# Patient Record
Sex: Male | Born: 1985 | Race: White | Hispanic: No | Smoking: Current some day smoker
Health system: Southern US, Community
[De-identification: ages and names within clinical notes are randomized; demographics above are authoritative.]

## PROBLEM LIST (undated history)

## (undated) DIAGNOSIS — F419 Anxiety disorder, unspecified: Secondary | ICD-10-CM

## (undated) DIAGNOSIS — B1920 Unspecified viral hepatitis C without hepatic coma: Secondary | ICD-10-CM

## (undated) DIAGNOSIS — F32A Depression, unspecified: Secondary | ICD-10-CM

## (undated) DIAGNOSIS — F1910 Other psychoactive substance abuse, uncomplicated: Secondary | ICD-10-CM

## (undated) HISTORY — DX: Other psychoactive substance abuse, uncomplicated: F19.10

## (undated) HISTORY — DX: Unspecified viral hepatitis C without hepatic coma: B19.20

## (undated) HISTORY — DX: Depression, unspecified: F32.A

## (undated) HISTORY — DX: Anxiety disorder, unspecified: F41.9

---
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
## 2006-10-30 ENCOUNTER — Emergency Department (HOSPITAL_COMMUNITY): Admission: EM | Admit: 2006-10-30 | Discharge: 2006-10-31 | Payer: Self-pay | Admitting: Emergency Medicine

## 2012-06-19 ENCOUNTER — Ambulatory Visit: Payer: Self-pay | Admitting: Emergency Medicine

## 2012-06-19 ENCOUNTER — Ambulatory Visit
Admission: RE | Admit: 2012-06-19 | Discharge: 2012-06-19 | Disposition: A | Discharge status: Home / Self Care | Payer: Self-pay | Source: Ambulatory Visit | Attending: Emergency Medicine | Admitting: Emergency Medicine

## 2012-06-19 VITALS — BP 122/80 | HR 67 | Temp 98.4°F | Resp 16 | Ht 70.0 in | Wt 183.0 lb

## 2012-06-19 DIAGNOSIS — G8929 Other chronic pain: Secondary | ICD-10-CM

## 2012-06-19 DIAGNOSIS — W57XXXA Bitten or stung by nonvenomous insect and other nonvenomous arthropods, initial encounter: Secondary | ICD-10-CM

## 2012-06-19 DIAGNOSIS — G44209 Tension-type headache, unspecified, not intractable: Secondary | ICD-10-CM

## 2012-06-19 DIAGNOSIS — Z8489 Family history of other specified conditions: Secondary | ICD-10-CM

## 2012-06-19 MED ORDER — MELOXICAM 7.5 MG PO TABS: ORAL_TABLET | ORAL | Status: DC

## 2012-06-19 MED ORDER — DOXYCYCLINE HYCLATE 100 MG PO TABS: 100.0000 mg | ORAL_TABLET | Freq: Two times a day (BID) | ORAL | Status: AC

## 2012-06-19 NOTE — Patient Instructions (Addendum)
Please proceed and have your CT scan done.

## 2012-06-19 NOTE — Progress Notes (Signed)
  Subjective:    Patient ID: Dale Graham, male    DOB: 11-10-1985, 26 y.o.   MRN: [id number] Patient comes into our office today with a headache he's had for 7 days he rates is pain a 7 out of 10 Patient had a low grade fever 5 or 6 days ago little cough  No chills or numbness has had a few small tick bites on his ankle, legs and scrotum no vomiting no sweats    Sore Throat  Associated symptoms include coughing (slight cough) and headaches (hurt all over more on right side of of head).  Headache  Associated symptoms include coughing (slight cough) and a fever (low grade 99.5).  Cough Associated symptoms include a fever (low grade 99.5) and headaches (hurt all over more on right side of of head).      Review of Systems  Constitutional: Positive for fever (low grade 99.5).  Respiratory: Positive for cough (slight cough).   Neurological: Positive for headaches (hurt all over more on right side of of head). Negative for light-headedness.       Objective:   Physical Exam HEENT exam is unremarkable. Neck is supple. Chest is clear to auscultation and percussion. Heart regular with no murmurs. Cranial nerves II through XII are intact motor strength is 5 out of 5 all muscle groups sensory exam is unremarkable  There are multiple insect bite areas over both ankles.      Assessment & Plan:  Patient with a one-week history of severe right occipital headache. He has a history of multiple tick bites. We'll cover with doxycycline x2 weeks plan on imaging his head give Mobic for his headache

## 2012-06-21 ENCOUNTER — Telehealth: Payer: Self-pay

## 2012-06-21 MED ORDER — TRAMADOL HCL 50 MG PO TABS
50.0000 mg | ORAL_TABLET | Freq: Four times a day (QID) | ORAL | Status: AC | PRN
Start: 2012-06-21 — End: 2012-07-01

## 2012-06-21 NOTE — Telephone Encounter (Signed)
Patient called stating that HA is worse while on Mobic, but he did not get Doxy filled due to cost.  I stressed importance of getting Doxy filled, and contacting other pharmacies to see if generic is available.  Maralyn Sago ok with having patient d/c Mobic and change to Ultram, if unable to afford--he can use Motrin OTC.  RTC if no improvement.  Patient agreed.

## 2012-06-21 NOTE — Telephone Encounter (Signed)
Pt states he saw dr Cleta Alberts for a headache yesterday and feels that the medication that he was given is making his headache worse   Best number [phone]

## 2012-06-26 ENCOUNTER — Encounter (HOSPITAL_COMMUNITY): Payer: Self-pay | Admitting: Emergency Medicine

## 2012-06-26 ENCOUNTER — Emergency Department (HOSPITAL_COMMUNITY)
Admission: EM | Admit: 2012-06-26 | Discharge: 2012-06-26 | Disposition: A | Discharge status: Home / Self Care | Payer: Self-pay | Attending: Emergency Medicine | Admitting: Emergency Medicine

## 2012-06-26 DIAGNOSIS — F17200 Nicotine dependence, unspecified, uncomplicated: Secondary | ICD-10-CM | POA: Insufficient documentation

## 2012-06-26 DIAGNOSIS — R51 Headache: Secondary | ICD-10-CM | POA: Insufficient documentation

## 2012-06-26 MED ORDER — SODIUM CHLORIDE 0.9 % IV BOLUS (SEPSIS)
1000.0000 mL | Freq: Once | INTRAVENOUS | Status: AC
  Administered 2012-06-26: 1000 mL via INTRAVENOUS

## 2012-06-26 MED ORDER — DEXAMETHASONE SODIUM PHOSPHATE 10 MG/ML IJ SOLN
INTRAMUSCULAR | Status: AC
  Filled 2012-06-26: qty 1

## 2012-06-26 MED ORDER — METOCLOPRAMIDE HCL 5 MG/ML IJ SOLN
10.0000 mg | Freq: Once | INTRAMUSCULAR | Status: AC
  Administered 2012-06-26: 10 mg via INTRAVENOUS
  Filled 2012-06-26: qty 2

## 2012-06-26 MED ORDER — DIPHENHYDRAMINE HCL 50 MG/ML IJ SOLN
25.0000 mg | Freq: Once | INTRAMUSCULAR | Status: AC
  Administered 2012-06-26: 25 mg via INTRAVENOUS
  Filled 2012-06-26: qty 1

## 2012-06-26 MED ORDER — DEXAMETHASONE SODIUM PHOSPHATE 4 MG/ML IJ SOLN
10.0000 mg | Freq: Once | INTRAMUSCULAR | Status: AC
  Administered 2012-06-26: 10 mg via INTRAVENOUS
  Filled 2012-06-26: qty 1

## 2012-06-26 MED ORDER — IBUPROFEN 800 MG PO TABS: 800.0000 mg | ORAL_TABLET | ORAL | Status: AC | PRN

## 2012-06-26 NOTE — ED Provider Notes (Signed)
History     CSN: [id number]  Arrival date & time 06/26/12  0441   First MD Initiated Contact with Patient 06/26/12 [phone]      Chief Complaint  Patient presents with  . Headache    (Consider location/radiation/quality/duration/timing/severity/associated sxs/prior treatment) HPI Comments: [place] 26 y.o. male   The chief complaint is: Patient presents with:   Headache   The patient has medical history significant for:   History reviewed. No pertinent past medical history.  Patient presents with persistent headaches for 2 weeks. Patient was seen at urgent care and given doxy and tramadol for suspected lyme disease as patient has had recent tick exposure. Patient states that the headaches are mostly right sided or temporal. Associated symptoms include photophobia. Patient states that he has some relief when taking Excedrin migraine. Patient believes his headaches may be due to a recent decrease in caffeine and or needing to wear glasses. Denies fever or chills. Denies cough, congestion, or sinus tenderness. Denies NVD. Denies neck stiffness. Denies that this is the worst headache of his life. Denies visual disturbance, aura, diplopia.   Of note, patient's mother works at an imaging center where she states he had a CT scan that was negative.       The history is provided by the patient.    History reviewed. No pertinent past medical history.  History reviewed. No pertinent past surgical history.  No family history on file.  History  Substance Use Topics  . Smoking status: Current Everyday Smoker  . Smokeless tobacco: Not on file  . Alcohol Use: No      Review of Systems  Constitutional: Negative for fever and chills.  HENT: Negative for congestion and sinus pressure.   Eyes: Positive for photophobia. Negative for visual disturbance.  Respiratory: Negative for cough.   Gastrointestinal: Negative for nausea, vomiting and diarrhea.  Neurological: Positive for  headaches.  Psychiatric/Behavioral: Negative for behavioral problems.  All other systems reviewed and are negative.    Allergies  Amoxicillin  Home Medications   Current Outpatient Rx  Name Route Sig Dispense Refill  . DOXYCYCLINE HYCLATE 100 MG PO TABS Oral Take 1 tablet (100 mg total) by mouth 2 (two) times daily. 28 tablet 0  . IBUPROFEN 800 MG PO TABS Oral Take 1 tablet (800 mg total) by mouth as needed (for headache). 21 tablet 0  . MELOXICAM 7.5 MG PO TABS  Take 1-2 tablets daily as needed for headache 30 tablet 0  . METHADONE HCL 10 MG/ML PO CONC Oral Take 70 mg by mouth daily.    . TRAMADOL HCL 50 MG PO TABS Oral Take 1 tablet (50 mg total) by mouth every 6 (six) hours as needed for pain. 20 tablet 0    BP 144/89  Pulse 76  Temp 97.8 F (36.6 C) (Oral)  Resp 20  SpO2 100%  Physical Exam  Nursing note and vitals reviewed. Constitutional: He appears well-developed and well-nourished. No distress.  HENT:  Head: Normocephalic and atraumatic.  Mouth/Throat: Oropharynx is clear and moist.       Patient has no tenderness to palpation of the maxillary or frontal sinuses.  Patient had no meningeal signs.  Eyes: Conjunctivae and EOM are normal. Pupils are equal, round, and reactive to light. No scleral icterus.  Neck: Normal range of motion. Neck supple.  Cardiovascular: Normal rate, regular rhythm and normal heart sounds.   Pulmonary/Chest: Effort normal and breath sounds normal.  Abdominal: Soft. Bowel sounds are normal.  There is no tenderness.  Musculoskeletal: Normal range of motion.  Lymphadenopathy:    He has no cervical adenopathy.  Neurological: He is alert. No cranial nerve deficit. He exhibits normal muscle tone.       Cranial nerves II-XII intact, no sensory deficits.  Skin: Skin is warm and dry.    ED Course  Procedures (including critical care time)  Labs Reviewed - No data to display No results found.   1. Headache       MDM  Patient presented  with headache X 2 weeks, with associated photophobia. Patient given migraine cocktail with improvement and resolution of his headaches. As for suspected lyme disease in nursing note, Dr. Dierdre Highman not convinced that these headaches are secondary to lyme encephalitis. Patient discharged on Ibuprofen with strict return precautions. Patient given recommendation to have vision screening. No red flags for subarachnoid, intercranial mass, or meningitis.         Pixie Casino, PA-C 06/26/12 [phone]

## 2012-06-26 NOTE — ED Notes (Signed)
PT. REPORTS PERSISTENT HEADACHE FOR 2 WEEKS , SEEN AT AN URGENT CARE PRESCRIBED WITH DOXYCYCLINE AND TRAMADOL ( SUSPECTED LYME DISEASE) , PAIN WORSE THIS MORNING .

## 2012-06-26 NOTE — ED Provider Notes (Signed)
Medical screening examination/treatment/procedure(s) were conducted as a shared visit with non-physician practitioner(s) and myself.  I personally evaluated the patient during the encounter. No neuro deficitis on exam, no meningismus, HA does not suggest infectious etiology or acute intracerebral catastrophe. Has had recent CT head for this HA and is improved in ED and stable for d/c home.   Sunnie Nielsen, MD 06/26/12 [phone]

## 2012-06-30 ENCOUNTER — Ambulatory Visit: Payer: Self-pay | Admitting: Emergency Medicine

## 2012-06-30 VITALS — BP 116/82 | HR 90 | Temp 98.3°F | Resp 16 | Ht 71.0 in | Wt 179.0 lb

## 2012-06-30 DIAGNOSIS — D7289 Other specified disorders of white blood cells: Secondary | ICD-10-CM

## 2012-06-30 DIAGNOSIS — R51 Headache: Secondary | ICD-10-CM

## 2012-06-30 DIAGNOSIS — R519 Headache, unspecified: Secondary | ICD-10-CM

## 2012-06-30 LAB — POCT CBC
Granulocyte percent: 51.6 %G (ref 37–80)
MCH, POC: 31.8 pg — AB (ref 27–31.2)
MCV: 95.7 fL (ref 80–97)
MID (cbc): 0.7 (ref 0–0.9)
POC LYMPH PERCENT: 41.7 %L (ref 10–50)
POC MID %: 6.7 %M (ref 0–12)
Platelet Count, POC: 347 10*3/uL (ref 142–424)
RDW, POC: 13 %

## 2012-06-30 NOTE — Progress Notes (Signed)
  Subjective:    Patient ID: Dale Graham Graham, male    DOB: [birth year], 26 y.o.   MRN: [id number]  HPI patient here because he has had persistent headaches. He was seen about a week ago for his headaches and a CT was obtained which was normal. His headaches began about 3 weeks ago and the right temporal area. They can be as bad as an 8/10 in intensity. The headache became so severe he went to the emergency room and received treatment since that time his headaches have been a level of about 2-3/10. He does get nauseated at times with the headaches. His headaches are nonexertional. They tend to occur when he is laying down at night.    Review of Systems     Objective:   Physical Exam HEENT exam is unremarkable his chest is clear his cardiac exam is unremarkable. Cranial nerves II through XII are intact. Motor strength is 5 out of 5 in all muscle groups. Deep tendon reflexes are 2+ and symmetrical. There no pathologic reflexes noted. Romberg did not reveal any ataxia or dysmetria.  Results for orders placed in visit on 06/30/12  POCT CBC      Component Value Range   WBC 10.0  4.6 - 10.2 K/uL   Lymph, poc 4.2 (*) 0.6 - 3.4   POC LYMPH PERCENT 41.7  10 - 50 %L   MID (cbc) 0.7  0 - 0.9   POC MID % 6.7  0 - 12 %M   POC Granulocyte 5.2  2 - 6.9   Granulocyte percent 51.6  37 - 80 %G   RBC 4.91  4.69 - 6.13 M/uL   Hemoglobin 15.6  14.1 - 18.1 g/dL   HCT, POC 78.2  95.6 - 53.7 %   MCV 95.7  80 - 97 fL   MCH, POC 31.8 (*) 27 - 31.2 pg   MCHC 33.2  31.8 - 35.4 g/dL   RDW, POC 21.3     Platelet Count, POC 347  142 - 424 K/uL   MPV 8.2  0 - 99.8 fL        Assessment & Plan:  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 I am going to make referral to the headache Center for his evaluation

## 2012-07-01 LAB — EPSTEIN-BARR VIRUS VCA ANTIBODY PANEL: EBV NA IgG: 22.2 U/mL — ABNORMAL HIGH (ref <18.0)

## 2012-07-02 LAB — ROCKY MTN SPOTTED FVR AB, IGM-BLOOD: ROCKY MTN SPOTTED FEVER, IGM: 0.37 IV

## 2012-07-04 ENCOUNTER — Telehealth: Payer: Self-pay

## 2012-07-04 NOTE — Telephone Encounter (Signed)
PT HAD LAB WORK DONE OVER 4 DAYS AGO AND IS STILL WAITING TO HEAR FROM SOMEONE. PLEASE CALL [phone]

## 2012-07-04 NOTE — Telephone Encounter (Signed)
Will you review the RMSF titer with me? I will call patient to advise.

## 2012-07-05 NOTE — Telephone Encounter (Signed)
Spoke wirh pt and advised labs. Pt understood

## 2012-07-05 NOTE — Telephone Encounter (Signed)
Labs show a history of mono, but not a current infection.  All other labs negative/normal.  Ordering provider has not reviewed or commented on.

## 2013-05-01 ENCOUNTER — Ambulatory Visit (INDEPENDENT_AMBULATORY_CARE_PROVIDER_SITE_OTHER): Payer: BC Managed Care – PPO | Admitting: Physician Assistant

## 2013-05-01 VITALS — BP 120/80 | HR 91 | Temp 98.0°F | Resp 16 | Ht 69.0 in | Wt 144.0 lb

## 2013-05-01 DIAGNOSIS — S139XXA Sprain of joints and ligaments of unspecified parts of neck, initial encounter: Secondary | ICD-10-CM

## 2013-05-01 DIAGNOSIS — S161XXA Strain of muscle, fascia and tendon at neck level, initial encounter: Secondary | ICD-10-CM

## 2013-05-01 DIAGNOSIS — F1121 Opioid dependence, in remission: Secondary | ICD-10-CM

## 2013-05-01 MED ORDER — IBUPROFEN 800 MG PO TABS: 800.0000 mg | ORAL_TABLET | Freq: Three times a day (TID) | ORAL | Status: DC | PRN

## 2013-05-01 MED ORDER — CYCLOBENZAPRINE HCL 5 MG PO TABS: 5.0000 mg | ORAL_TABLET | Freq: Three times a day (TID) | ORAL | Status: DC | PRN

## 2013-05-01 NOTE — Progress Notes (Signed)
   [address] [place] [address]   Phone [phone]  Subjective:    Patient ID: Dale Graham, male    DOB: 1985-12-31, 27 y.o.   MRN: [id number]  HPI Pt presents to clinic with neck pain for about the last month without an injury that he knows of.  He does not have pain every day - drives a fork-lift for work.  He has done nothing for the pain - no heat or NSAIDs.  Pt has been off Methadone for about 7 months.  He also has a swelling on his neck that he has had for about 6 months and he wonders if they are related.   Review of Systems  Musculoskeletal: Positive for back pain (neck on the L).  Neurological: Negative for weakness and numbness.       Objective:   Physical Exam  Vitals reviewed. Constitutional: He is oriented to person, place, and time. He appears well-developed and well-nourished.  HENT:  Head: Normocephalic and atraumatic.  Right Ear: External ear normal.  Left Ear: External ear normal.  Pulmonary/Chest: Effort normal.  Musculoskeletal:       Cervical back: He exhibits spasm (L sternoicleidomastoid muscle spasms). He exhibits normal range of motion, no tenderness and no bony tenderness.  Neurological: He is alert and oriented to person, place, and time. He has normal strength. No sensory deficit.  Reflex Scores:      Bicep reflexes are 2+ on the right side and 2+ on the left side.      Brachioradialis reflexes are 2+ on the right side and 2+ on the left side. Skin: Skin is warm and dry.     Psychiatric: He has a normal mood and affect. His behavior is normal. Judgment and thought content normal.       Assessment & Plan:  Neck strain, initial encounter - Plan: ibuprofen (ADVIL,MOTRIN) 800 MG tablet, cyclobenzaprine (FLEXERIL) 5 MG tablet -- heat and stretches to the area -- recheck if no improvement.  If cyst starts to bother him he can make an appt for removal.    Benny Lennert PA-C 05/01/2013 6:06 PM

## 2015-09-18 ENCOUNTER — Encounter: Payer: Self-pay | Admitting: *Deleted

## 2015-09-18 ENCOUNTER — Emergency Department
Admission: EM | Admit: 2015-09-18 | Discharge: 2015-09-18 | Disposition: A | Discharge status: Home / Self Care | Payer: Self-pay | Attending: Emergency Medicine | Admitting: Emergency Medicine

## 2015-09-18 DIAGNOSIS — Y998 Other external cause status: Secondary | ICD-10-CM | POA: Insufficient documentation

## 2015-09-18 DIAGNOSIS — F17200 Nicotine dependence, unspecified, uncomplicated: Secondary | ICD-10-CM | POA: Insufficient documentation

## 2015-09-18 DIAGNOSIS — Y9289 Other specified places as the place of occurrence of the external cause: Secondary | ICD-10-CM | POA: Insufficient documentation

## 2015-09-18 DIAGNOSIS — W268XXA Contact with other sharp object(s), not elsewhere classified, initial encounter: Secondary | ICD-10-CM | POA: Insufficient documentation

## 2015-09-18 DIAGNOSIS — S61012A Laceration without foreign body of left thumb without damage to nail, initial encounter: Secondary | ICD-10-CM | POA: Insufficient documentation

## 2015-09-18 DIAGNOSIS — Y9389 Activity, other specified: Secondary | ICD-10-CM | POA: Insufficient documentation

## 2015-09-18 MED ORDER — CEPHALEXIN 500 MG PO CAPS: 500.0000 mg | ORAL_CAPSULE | Freq: Three times a day (TID) | ORAL | Status: AC

## 2015-09-18 NOTE — ED Notes (Signed)
Pt reports cutting a box with a box cutter about a half hour ago when he cut his left thumb on the side of the nail. Slight bleeding noted and pressure dressing applied.

## 2015-09-18 NOTE — ED Provider Notes (Signed)
[facility] Emergency Department Provider Note  ____________________________________________  Time seen:  5:55 AM  I have reviewed the triage vital signs and the nursing notes.  History by:  Patient  HISTORY  Chief Complaint Laceration  to left thumb    HPI Dale Graham is a 29 y.o. male who is short with a box cutter and cut the lateral portion of his distal left thumb. This is just adjacent to the nail of this thumb. He reports some bleeding but overall bleeding is controlled. He has moderate discomfort but no acute distress.    History reviewed. No pertinent past medical history.  Patient Active Problem List   Diagnosis Date Noted  . History of narcotic addiction (HCC) 05/01/2013    History reviewed. No pertinent past surgical history.  Current Outpatient Rx  Name  Route  Sig  Dispense  Refill  . cyclobenzaprine (FLEXERIL) 5 MG tablet   Oral   Take 1 tablet (5 mg total) by mouth 3 (three) times daily as needed for muscle spasms.   30 tablet   0   . ibuprofen (ADVIL,MOTRIN) 800 MG tablet   Oral   Take 1 tablet (800 mg total) by mouth every 8 (eight) hours as needed for pain.   30 tablet   0     Allergies Review of patient's allergies indicates no active allergies.  History reviewed. No pertinent family history.  Social History Social History  Substance Use Topics  . Smoking status: Current Every Day Smoker  . Smokeless tobacco: Never Used  . Alcohol Use: Yes     Comment: rarely    Review of Systems  Constitutional: Negative for fever/chills. ENT: Negative for congestion. Cardiovascular: Negative for chest pain. Respiratory: Negative for cough. Gastrointestinal: Negative for abdominal pain, vomiting and diarrhea. Genitourinary: Negative for dysuria. Musculoskeletal: No back pain. Skin: Laceration to left thumb. See history of present illness. Neurological: Negative for headache or focal weakness   10-point ROS  otherwise negative.  ____________________________________________   PHYSICAL EXAM:  VITAL SIGNS: ED Triage Vitals  Enc Vitals Group     BP 09/18/15 0521 147/91 mmHg     Pulse Rate 09/18/15 0521 104     Resp 09/18/15 0521 18     Temp 09/18/15 0521 99.1 F (37.3 C)     Temp Source 09/18/15 0521 Oral     SpO2 09/18/15 0521 94 %     Weight --      Height 09/18/15 0521 6' (1.829 m)     Head Cir --      Peak Flow --      Pain Score 09/18/15 0505 6     Pain Loc --      Pain Edu? --      Excl. in GC? --     Constitutional:  Alert and oriented. Well appearing and in no distress. Cardiovascular: Normal rate, regular rhythm, no murmur noted Respiratory:  Normal respiratory effort, no tachypnea.    Breath sounds are clear and equal bilaterally.  Gastrointestinal: Soft, no distention. Nontender Musculoskeletal: Noted oblique flap laceration to the lateral edge of the distal left thumb. Other than this there is no deformity. Nontender with normal range of motion in all other extremities.  No noted edema. Neurologic:  Communicative. Normal appearing spontaneous movement in all 4 extremities. No gross focal neurologic deficits are appreciated.  Skin:  There is an oblique laceration undermining some of the tissue of the distal lateral left thumb. This semicircular cut measures  approximately 1-1/2 cm across. The flap appears to be full thickness and intact, however the skin color over the flap is slightly dusky. The edges of the flap are darker. I have pointed this out to the patient and discussed the stability for slower healing or nonviable skin edges. Psychiatric: Mood and affect are normal. Speech and behavior are normal.  ____________________________________________     PROCEDURES  Laceration repair   After discussing options for repair of laceration, the patient I agreed on Steri-Strips. We discussed sutures, that he preferred Steri-Strips and I felt that the additional trauma to the  skin edges would not be beneficial. We have discussed the care he needs to take but this wound and the possibility that he will have skin that is nonviable even the dusky appearance of some of the skin.  The patient soaked in a mild Betadine solution for approximate 5 minutes, then I further irrigated the wound and explore the wound. There were no foreign bodies. We then dried and clean the area and applied benzoin to the outer skin. Using quarter-inch Steri-Strips, I was able to hold the flap down firmly with good approximation.  ____________________________________________   INITIAL IMPRESSION / ASSESSMENT AND PLAN / ED COURSE  Pertinent labs & imaging results that were available during my care of the patient were reviewed by me and considered in my medical decision making (see chart for details).  Oblique laceration to the left thumb. Question of blood supply to portions of it. Overall the flap looks viable, but I discussed the possibility of slow healing or nonviable with the patient. He preferred Steri-Strips. The wound was closed in this fashion. I've asked him to follow up with another physician for wound care or return to the emergency department.  ____________________________________________   FINAL CLINICAL IMPRESSION(S) / ED DIAGNOSES  Final diagnoses:  Laceration of left thumb, initial encounter      Darien Ramus, MD 09/18/15 [phone]

## 2015-09-18 NOTE — Discharge Instructions (Signed)
We agreed to close the wound with Steri-Strips. This is holding the flap of tissue down well and it is approximated well. You had some dusky edges to the flap and you may have some difficulty with slow healing or viability of the skin edges. Follow-up to have the wound rechecked in 2-3 days either with [facility] clinic or here at the emergency department. Return sooner if you start developed redness or other signs of infection or if you have other urgent concerns.  Stitches, Staples, or Adhesive Wound Closure Doctors use stitches (sutures), staples, and certain glue (skin adhesives) to hold your skin together while it heals (wound closure). You may need this treatment after you have surgery or if you cut your skin accidentally. These methods help your skin heal more quickly. They also make it less likely that you will have a scar. WHAT ARE THE DIFFERENT KINDS OF WOUND CLOSURES? There are many options for wound closure. The one that your doctor uses depends on how deep and large your wound is. Adhesive Glue To use this glue to close a wound, your doctor holds the edges of the wound together and paints the glue on the surface of your skin. You may need more than one layer of glue. Then the wound may be covered with a light bandage (dressing). This type of skin closure may be used for small wounds that are not deep (superficial). Using glue for wound closure is less painful than other methods. It does not require a medicine that numbs the area. This method also leaves nothing to be removed. Adhesive glue is often used for children and on facial wounds. Adhesive glue cannot be used for wounds that are deep, uneven, or bleeding. It is not used inside of a wound.  Adhesive Strips These strips are made of sticky (adhesive), porous paper. They are placed across your skin edges like a regular adhesive bandage. You leave them on until they fall off. Adhesive strips may be used to close very superficial wounds. They  may also be used along with sutures to improve closure of your skin edges.  Sutures Sutures are the oldest method of wound closure. Sutures can be made from natural or synthetic materials. They can be made from a material that your body can break down as your wound heals (absorbable), or they can be made from a material that needs to be removed from your skin (nonabsorbable). They come in many different strengths and sizes. Your doctor attaches the sutures to a steel needle on one end. Sutures can be passed through your skin, or through the tissues beneath your skin. Then they are tied and cut. Your skin edges may be closed in one continuous stitch or in separate stitches. Sutures are strong and can be used for all kinds of wounds. Absorbable sutures may be used to close tissues under the skin. The disadvantage of sutures is that they may cause skin reactions that lead to infection. Nonabsorbable sutures need to be removed. Staples When surgical staples are used to close a wound, the edges of your skin on both sides of the wound are brought close together. A staple is placed across the wound, and an instrument secures the edges together. Staples are often used to close surgical cuts (incisions). Staples are faster to use than sutures, and they cause less reaction from your skin. Staples need to be removed using a tool that bends the staples away from your skin. HOW DO I CARE FOR MY WOUND CLOSURE?  Take medicines only as told by your doctor.  If you were prescribed an antibiotic medicine for your wound, finish it all even if you start to feel better.  Use ointments or creams only as told by your doctor.  Wash your hands with soap and water before and after touching your wound.  Do not soak your wound in water. Do not take baths, swim, or use a hot tub until your doctor says it is okay.  Ask your doctor when you can start showering. Cover your wound if told by your doctor.  Do not take out your  own sutures or staples.  Do not pick at your wound. Picking can cause an infection.  Keep all follow-up visits as told by your doctor. This is important. HOW LONG WILL I HAVE MY WOUND CLOSURE?   Leave adhesive glue on your skin until the glue peels away.  Leave adhesive strips on your skin until they fall off.  Absorbable sutures will dissolve within several days.  Nonabsorbable sutures and staples must be removed. The location of the wound will determine how long they stay in. This can range from several days to a couple of weeks. WHEN SHOULD I SEEK HELP FOR MY WOUND CLOSURE? Contact your doctor if:  You have a fever.  You have chills.  You have redness, puffiness (swelling), or pain at the site of your wound.  You have fluid, blood, or pus coming from your wound.  There is a bad smell coming from your wound.  The skin edges of your wound start to separate after your sutures have been removed.  Your wound becomes thick, raised, and darker in color after your sutures come out (scarring).   This information is not intended to replace advice given to you by your health care provider. Make sure you discuss any questions you have with your health care provider.   Document Released: 08/05/2009 Document Revised: 10/29/2014 Document Reviewed: 03/17/2014 Elsevier Interactive Patient Education [employer]2016 [employer].

## 2016-11-27 ENCOUNTER — Ambulatory Visit (INDEPENDENT_AMBULATORY_CARE_PROVIDER_SITE_OTHER): Payer: BLUE CROSS/BLUE SHIELD | Admitting: Internal Medicine

## 2016-11-27 ENCOUNTER — Encounter: Payer: Self-pay | Admitting: Internal Medicine

## 2016-11-27 VITALS — BP 120/80 | HR 93 | Temp 98.1°F | Ht 69.0 in | Wt 175.0 lb

## 2016-11-27 DIAGNOSIS — F1121 Opioid dependence, in remission: Secondary | ICD-10-CM | POA: Diagnosis not present

## 2016-11-27 DIAGNOSIS — B1920 Unspecified viral hepatitis C without hepatic coma: Secondary | ICD-10-CM | POA: Diagnosis not present

## 2016-11-27 DIAGNOSIS — H6122 Impacted cerumen, left ear: Secondary | ICD-10-CM | POA: Diagnosis not present

## 2016-11-27 MED ORDER — PROMETHAZINE-DM 6.25-15 MG/5ML PO SYRP: 5.0000 mL | ORAL_SOLUTION | Freq: Every evening | ORAL | 0 refills | Status: DC | PRN

## 2016-11-27 NOTE — Assessment & Plan Note (Signed)
Currently not an issue Will monitor 

## 2016-11-27 NOTE — Assessment & Plan Note (Signed)
Will request labs from Dr. Orvan Falconerampbell Once reviewed, will refer to the liver care clinic

## 2016-11-27 NOTE — Progress Notes (Signed)
HPI  Pt presents to the clinic today to establish care and for management of the conditions listed below. He has not had a PCP in many years.  Hepatitis C: He reports this is a recent diagnosis, made by Dr. Orvan Falconerampbell at [facility]. He has not had any additional workup. 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 He is currently taking Suboxone.  History of Narcotic Abuse: Many years ago. This is no longer an issue.  He also c/o nasal congestion and cough. This started 10 days ago. He is blowing clear mucous out of his nose. The cough is productive of clear mucous. He denies fever, chills or body aches. He has taken Ibuprofen OTC with minimal relief.  Flu: never Tetanus < 10 years Dentist: annually Past Medical History:  Diagnosis Date  . Hepatitis-C     Current Outpatient Prescriptions  Medication Sig Dispense Refill  . SUBOXONE 8-2 MG FILM TAKE 1 AND 1/4 FILM UNDER THE TONGUE DAILY  0   No current facility-administered medications for this visit.     No Known Allergies  Family History  Problem Relation Age of Onset  . Diabetes Father   . Diabetes Maternal Grandmother     Social History   Social History  . Marital status: Single    Spouse name: N/A  . Number of children: N/A  . Years of education: N/A   Occupational History  . Not on file.   Social History Main Topics  . Smoking status: Current Some Day Smoker  . Smokeless tobacco: Never Used  . Alcohol use Yes     Comment: rarely  . Drug use: No  . Sexual activity: Not on file   Other Topics Concern  . Not on file   Social History Narrative  . No narrative on file    ROS:  Constitutional: Denies fever, malaise, fatigue, headache or abrupt weight changes.  HEENT: Pt reports runny nose. Denies eye pain, eye redness, ear pain, ringing in the ears, wax buildup, nasal congestion, bloody nose, or sore throat. Respiratory: Pt reports cough. Denies difficulty breathing, shortness of  breath, or sputum production.   Cardiovascular: Denies chest pain, chest tightness, palpitations or swelling in the hands or feet.  Gastrointestinal: Denies abdominal pain, bloating, constipation, diarrhea or blood in the stool.  GU: Denies frequency, urgency, pain with urination, blood in urine, odor or discharge. Musculoskeletal: Denies decrease in range of motion, difficulty with gait, muscle pain or joint pain and swelling.  Skin: Denies redness, rashes, lesions or ulcercations.  Neurological: Denies dizziness, difficulty with memory, difficulty with speech or problems with balance and coordination.  Psych: Denies anxiety, depression, SI/HI.  No other specific complaints in a complete review of systems (except as listed in HPI above).  PE:  BP 120/80   Pulse 93   Temp 98.1 F (36.7 C) (Oral)   Ht 5\' 9"  (1.753 m)   Wt 175 lb (79.4 kg)   SpO2 98%   BMI 25.84 kg/m  Wt Readings from Last 3 Encounters:  11/27/16 175 lb (79.4 kg)  05/01/13 144 lb (65.3 kg)  06/30/12 179 lb (81.2 kg)    General: Appears his stated age, well developed, well nourished in NAD. HEENT: Head: normal shape and size, no sinus tenderness noted; Right Ear: Tm's gray and intact, normal light reflex; Left Ear: cerumen impaction; Throat/Mouth: Teeth present, mucosa pink and moist, no lesions or ulcerations noted.  Neck: No adenopathy noted. Cardiovascular: Normal  rate and rhythm.  Pulmonary/Chest: Normal effort and positive vesicular breath sounds. No respiratory distress. No wheezes, rales or ronchi noted.  Abdomen: Soft and nontender. Normal bowel sounds. No distention or masses noted. Liver, spleen and kidneys non palpable. Neurological: Alert and oriented.  Psychiatric: Mood and affect normal. Behavior is normal. Judgment and thought content normal.     Assessment and Plan:  Viral URI with cough:  Improving eRx for Phenergan with Dextromethorphan cough syrup  Left cerumen impaction:  He wanted  this flushed out but left before having this done  Make an appt for your annual exam Nicki Reaper, NP

## 2016-11-27 NOTE — Patient Instructions (Signed)
Hepatitis C Hepatitis C is a liver infection. It is caused by a germ that can spread through blood and other bodily fluids. Your doctor will use blood and liver tests to:  Check for this infection.  Decide how to treat you.  Check your health after treatment.  Follow these instructions at home:  Rest.  Do not take any medicine unless your doctor says it is okay. This includes over-the-counter medicine and birth control pills.  Do not drink alcohol.  Do not have sex until your doctor says it is okay.  Do not share toothbrushes, nail clippers, razors, or needles.  Take all medicines as told by your doctor. Contact a doctor if:  You have a fever.  Your belly (abdomen) hurts.  Your pee (urine) is dark.  Your poop (bowel movement) is the color of clay.  You have joint pain. Get help right away if:  You feel more and more tired (fatigued).  You feel more and more weak.  You do not feel like eating.  You feel sick to your stomach (nauseous) or throw up (vomit).  Your skin or the whites of your eyes turn yellow (jaundice) or turn more yellow than they were before.  You bruise or bleed easily. This information is not intended to replace advice given to you by your health care provider. Make sure you discuss any questions you have with your health care provider. Document Released: 09/20/2008 Document Revised: 03/15/2016 Document Reviewed: 01/20/2014 Elsevier Interactive Patient Education  2017 Elsevier Inc.  

## 2016-11-30 ENCOUNTER — Telehealth: Payer: Self-pay

## 2016-11-30 DIAGNOSIS — R768 Other specified abnormal immunological findings in serum: Secondary | ICD-10-CM

## 2016-11-30 NOTE — Telephone Encounter (Signed)
Left detailed msg on VM per HIPAA Pt needs to come in for lab only appt for additional labs---if pt returns call, please schedule lab appt and forward to Rene KocherRegina so that she can order the additional labs

## 2016-12-05 NOTE — Telephone Encounter (Signed)
I called pt and a recording came on stating the number has been changed or disconnected

## 2016-12-06 NOTE — Telephone Encounter (Signed)
Pt is coming in on Monday afternoon for lab only appt--please future order labs

## 2016-12-07 NOTE — Addendum Note (Signed)
Addended by: Lorre MunroeBAITY, REGINA W on: 12/07/2016 07:48 PM   Modules accepted: Orders

## 2016-12-07 NOTE — Telephone Encounter (Signed)
Labs ordered.

## 2016-12-10 ENCOUNTER — Other Ambulatory Visit: Payer: BLUE CROSS/BLUE SHIELD

## 2017-06-17 ENCOUNTER — Ambulatory Visit: Payer: Self-pay | Admitting: Physician Assistant

## 2017-07-25 ENCOUNTER — Encounter: Payer: Self-pay | Admitting: Internal Medicine

## 2017-07-25 ENCOUNTER — Other Ambulatory Visit: Payer: Self-pay | Admitting: Internal Medicine

## 2017-07-25 DIAGNOSIS — B1920 Unspecified viral hepatitis C without hepatic coma: Secondary | ICD-10-CM

## 2018-01-29 ENCOUNTER — Encounter: Payer: Self-pay | Admitting: Physician Assistant

## 2018-08-06 ENCOUNTER — Telehealth: Payer: Self-pay | Admitting: Internal Medicine

## 2018-08-06 NOTE — Telephone Encounter (Signed)
Not needed

## 2018-08-07 NOTE — Telephone Encounter (Signed)
See below-ah

## 2018-08-07 NOTE — Telephone Encounter (Signed)
He was supposed to have come for confirmatory labs, and referral was placed 07/2017. Shirlee Limerick, can you see what happened with referral?

## 2018-08-07 NOTE — Telephone Encounter (Signed)
Pt sent this MyChart message yesterday stating "I dont actually need an appointment, i need a referral from Dr Sampson Si for Hepatitis C. She was supposed to have referral last year but I never heard back. I need to see specialist ASAP. If any questions, please call [phone]. Thank you!"   Do this pt need an appt to come back in to see you or can you refer him to a specialist. Please advise

## 2018-08-07 NOTE — Telephone Encounter (Signed)
Ref[facility] was [facility] October [facility] [place], they see patients for Hep C, through epic system. Their office has called patient 5 times from October to January 2019 and did not hear back from patient, referral was closed by their office after multiple attempts.  I left patient a message to call me back and discuss this with him.-Taelar Gronewold V Add Dinapoli, RMA

## 2018-08-11 NOTE — Telephone Encounter (Signed)
Sent my chart message today-Dale Graham, RMA

## 2018-08-12 NOTE — Telephone Encounter (Signed)
Called patient again and called patient's mother and left a message to get patient to call us back-Anastasiya Ander Purpura, RMA

## 2018-08-13 NOTE — Telephone Encounter (Signed)
Just wanted to let you know that I have made several attempts to the patient and his mother and have not heard back from patient still to discuss everything.-Adyan Palau V Sherby Moncayo, RMA

## 2018-08-14 NOTE — Telephone Encounter (Signed)
noted 

## 2019-10-11 ENCOUNTER — Emergency Department (HOSPITAL_COMMUNITY): Payer: Medicaid Other

## 2019-10-11 ENCOUNTER — Other Ambulatory Visit: Payer: Self-pay

## 2019-10-11 ENCOUNTER — Encounter (HOSPITAL_COMMUNITY): Payer: Self-pay | Admitting: Emergency Medicine

## 2019-10-11 ENCOUNTER — Emergency Department (HOSPITAL_COMMUNITY)
Admission: EM | Admit: 2019-10-11 | Discharge: 2019-10-11 | Disposition: A | Discharge status: Home / Self Care | Payer: Medicaid Other | Attending: Emergency Medicine | Admitting: Emergency Medicine

## 2019-10-11 DIAGNOSIS — Z5321 Procedure and treatment not carried out due to patient leaving prior to being seen by health care provider: Secondary | ICD-10-CM | POA: Insufficient documentation

## 2019-10-11 DIAGNOSIS — R42 Dizziness and giddiness: Secondary | ICD-10-CM | POA: Insufficient documentation

## 2019-10-11 DIAGNOSIS — R0789 Other chest pain: Secondary | ICD-10-CM | POA: Insufficient documentation

## 2019-10-11 DIAGNOSIS — R05 Cough: Secondary | ICD-10-CM | POA: Insufficient documentation

## 2019-10-11 LAB — CBC
HCT: 44.5 % (ref 39.0–52.0)
Hemoglobin: 15.3 g/dL (ref 13.0–17.0)
MCH: 29.6 pg (ref 26.0–34.0)
MCHC: 34.4 g/dL (ref 30.0–36.0)
MCV: 86.1 fL (ref 80.0–100.0)
Platelets: 350 10*3/uL (ref 150–400)
RBC: 5.17 MIL/uL (ref 4.22–5.81)
RDW: 13.3 % (ref 11.5–15.5)
WBC: 10.6 10*3/uL — ABNORMAL HIGH (ref 4.0–10.5)
nRBC: 0 % (ref 0.0–0.2)

## 2019-10-11 LAB — BASIC METABOLIC PANEL
Anion gap: 13 (ref 5–15)
BUN: 14 mg/dL (ref 6–20)
CO2: 21 mmol/L — ABNORMAL LOW (ref 22–32)
Calcium: 9.5 mg/dL (ref 8.9–10.3)
Chloride: 102 mmol/L (ref 98–111)
Creatinine, Ser: 1.1 mg/dL (ref 0.61–1.24)
GFR calc Af Amer: >60 mL/min (ref >60)
GFR calc non Af Amer: >60 mL/min (ref >60)
Glucose, Bld: 106 mg/dL — ABNORMAL HIGH (ref 70–99)
Potassium: 3.9 mmol/L (ref 3.5–5.1)
Sodium: 136 mmol/L (ref 135–145)

## 2019-10-11 LAB — TROPONIN I (HIGH SENSITIVITY): Troponin I (High Sensitivity): 4 ng/L (ref <18)

## 2019-10-11 MED ORDER — SODIUM CHLORIDE 0.9% FLUSH: 3.0000 mL | Freq: Once | INTRAVENOUS | Status: DC

## 2019-10-11 NOTE — ED Notes (Signed)
Called for VS recheck x3, no response. 

## 2019-10-11 NOTE — ED Notes (Signed)
Called for pt x3 for blood. No answer.

## 2019-10-11 NOTE — ED Triage Notes (Signed)
Pt reports cough x1 week, states that he began having some chest tightness and lightheadedness tonight, reports his baby is in the nicu and he was told he had to get checked out prior to seeing his son. Pt a/ox4, resp e/u, nad. Denies any recent sick contacts.

## 2019-10-12 ENCOUNTER — Emergency Department (HOSPITAL_COMMUNITY)
Admission: EM | Admit: 2019-10-12 | Discharge: 2019-10-12 | Disposition: A | Discharge status: Home / Self Care | Payer: Medicaid Other | Attending: Emergency Medicine | Admitting: Emergency Medicine

## 2019-10-12 ENCOUNTER — Other Ambulatory Visit: Payer: Self-pay

## 2019-10-12 DIAGNOSIS — Z5320 Procedure and treatment not carried out because of patient's decision for unspecified reasons: Secondary | ICD-10-CM | POA: Insufficient documentation

## 2019-10-12 DIAGNOSIS — J029 Acute pharyngitis, unspecified: Secondary | ICD-10-CM | POA: Insufficient documentation

## 2019-10-12 NOTE — ED Triage Notes (Signed)
Pt reports having dry cough x1 week. Reports needing covid swab done so he will be able to visit his child in the NICU.

## 2020-01-16 DIAGNOSIS — L259 Unspecified contact dermatitis, unspecified cause: Secondary | ICD-10-CM | POA: Diagnosis not present

## 2020-01-16 DIAGNOSIS — L0390 Cellulitis, unspecified: Secondary | ICD-10-CM | POA: Diagnosis not present

## 2020-05-30 ENCOUNTER — Telehealth: Payer: Self-pay | Admitting: Internal Medicine

## 2020-05-30 NOTE — Telephone Encounter (Signed)
Patient's fiance, Kieth Brightly, called.  Patient hasn't seen Rene Kocher in over 3 years and would like to be re-established.  Patient has Medicaid [employer].  Can patient be re-established?

## 2020-05-31 NOTE — Telephone Encounter (Signed)
If we are accepting his insurance, it is okay for him to reestablish in the next available new pt slot.

## 2020-07-28 ENCOUNTER — Ambulatory Visit: Payer: Medicaid Other | Admitting: Internal Medicine

## 2020-10-29 DIAGNOSIS — Z20822 Contact with and (suspected) exposure to covid-19: Secondary | ICD-10-CM | POA: Diagnosis not present

## 2021-05-09 ENCOUNTER — Ambulatory Visit: Payer: Medicaid Other | Admitting: Family Medicine

## 2021-05-09 ENCOUNTER — Encounter: Payer: Self-pay | Admitting: Family Medicine

## 2021-05-09 ENCOUNTER — Other Ambulatory Visit: Payer: Self-pay

## 2021-05-09 VITALS — BP 130/92 | HR 102 | Temp 98.3°F | Ht 71.0 in | Wt 192.0 lb

## 2021-05-09 DIAGNOSIS — F411 Generalized anxiety disorder: Secondary | ICD-10-CM | POA: Insufficient documentation

## 2021-05-09 DIAGNOSIS — B1920 Unspecified viral hepatitis C without hepatic coma: Secondary | ICD-10-CM

## 2021-05-09 DIAGNOSIS — F1121 Opioid dependence, in remission: Secondary | ICD-10-CM

## 2021-05-09 MED ORDER — CITALOPRAM HYDROBROMIDE 20 MG PO TABS: 20.0000 mg | ORAL_TABLET | Freq: Every day | ORAL | 3 refills | Status: DC

## 2021-05-09 NOTE — Assessment & Plan Note (Signed)
GAD 7 : Generalized Anxiety Score 05/09/2021  Nervous, Anxious, on Edge 3  Control/stop worrying 3  Worry too much - different things 3  Trouble relaxing 3  Restless 3  Easily annoyed or irritable 3  Afraid - awful might happen 3  Total GAD 7 Score 21  Anxiety Difficulty Extremely difficult    Depression screen PHQ 2/9 05/09/2021  Decreased Interest 3  Down, Depressed, Hopeless 3  PHQ - 2 Score 6  Altered sleeping 3  Tired, decreased energy 3  Change in appetite 3  Feeling bad or failure about yourself  3  Trouble concentrating 3  Moving slowly or fidgety/restless 3  Suicidal thoughts 0  PHQ-9 Score 24  Difficult doing work/chores Extremely dIfficult   Given PHQ and GAD scoring, treatment strategies were reviewed and patient is amenable to initiation of pharmacotherapy.  I have written for citalopram 20 mg daily, I have placed referral to psychiatry for additional management options, and have advised patient to monitor for any worsening or new symptomatology, in particular suicidal/homicidal ideations.  He is to perform virtual video visit in 4 weeks for further management/titration to be considered.

## 2021-05-09 NOTE — Progress Notes (Signed)
Primary Care / Sports Medicine Office Visit  Patient Information:  Patient ID: Dale Graham, male DOB: May 23, 1986 Age: 35 y.o. MRN: [id number]   Dale S League is a pleasant 35 y.o. male presenting with the following:  Chief Complaint  Patient presents with   New Patient (Initial Visit)   Establish Care   Anxiety    Recovering addict; sober x2 months per patient; went to jail 6/15-6/30/22 for failed drug test   Hepatitis C    Contracted from needle sharing; not treated; referral to infectious disease    Review of Systems pertinent details above   Patient Active Problem List   Diagnosis Date Noted   GAD (generalized anxiety disorder) 05/09/2021   Hepatitis C 11/27/2016   History of narcotic addiction (HCC) 05/01/2013   Past Medical History:  Diagnosis Date   Hepatitis-C    Substance abuse [facility])    Outpatient Encounter Medications as of 05/09/2021  Medication Sig   citalopram (CELEXA) 20 MG tablet Take 1 tablet (20 mg total) by mouth daily.   [DISCONTINUED] promethazine-dextromethorphan (PROMETHAZINE-DM) 6.25-15 MG/5ML syrup Take 5 mLs by mouth at bedtime as needed for cough.   [DISCONTINUED] SUBOXONE 8-2 MG FILM TAKE 1 AND 1/4 FILM UNDER THE TONGUE DAILY   No facility-administered encounter medications on file as of 05/09/2021.   Past Surgical History:  Procedure Laterality Date   ROOT CANAL Left 2015    Vitals:   05/09/21 1103  BP: (!) 130/92  Pulse: (!) 102  Temp: 98.3 F (36.8 C)  SpO2: 97%   Vitals:   05/09/21 1103  Weight: 192 lb (87.1 kg)  Height: 5\' 11"  (1.803 m)   Body mass index is 26.78 kg/m.  No results found.   Independent interpretation of notes and tests performed by another provider:   None  Procedures performed:   None  Pertinent History, Exam, Impression, and Recommendations:   Hepatitis C Patient with history of longstanding hepatitis C, contracted through needle sharing.  He is currently asymptomatic, is interested in  definitive evaluation and management.  Physical examination findings today reveals no icterus, no GI symptoms, no ecchymosis, no hepatomegaly.  He is amenable to referral to infectious disease, one was coordinated through our office today.  We will follow peripherally.  GAD (generalized anxiety disorder) GAD 7 : Generalized Anxiety Score 05/09/2021  Nervous, Anxious, on Edge 3  Control/stop worrying 3  Worry too much - different things 3  Trouble relaxing 3  Restless 3  Easily annoyed or irritable 3  Afraid - awful might happen 3  Total GAD 7 Score 21  Anxiety Difficulty Extremely difficult    Depression screen PHQ 2/9 05/09/2021  Decreased Interest 3  Down, Depressed, Hopeless 3  PHQ - 2 Score 6  Altered sleeping 3  Tired, decreased energy 3  Change in appetite 3  Feeling bad or failure about yourself  3  Trouble concentrating 3  Moving slowly or fidgety/restless 3  Suicidal thoughts 0  PHQ-9 Score 24  Difficult doing work/chores Extremely dIfficult   Given PHQ and GAD scoring, treatment strategies were reviewed and patient is amenable to initiation of pharmacotherapy.  I have written for citalopram 20 mg daily, I have placed referral to psychiatry for additional management options, and have advised patient to monitor for any worsening or new symptomatology, in particular suicidal/homicidal ideations.  He is to perform virtual video visit in 4 weeks for further management/titration to be considered.   Orders & Medications Meds ordered  this encounter  Medications   citalopram (CELEXA) 20 MG tablet    Sig: Take 1 tablet (20 mg total) by mouth daily.    Dispense:  30 tablet    Refill:  3   Orders Placed This Encounter  Procedures   Ambulatory referral to Infectious Disease   Ambulatory referral to Psychiatry     Return in about 4 weeks (around 06/06/2021) for MyChart video visit follow-up.     Jerrol Banana, MD   Primary Care Sports Medicine [facility]

## 2021-05-09 NOTE — Patient Instructions (Addendum)
-   Start Celexa (citalopram) daily - Referrals coordinate will contact you regarding follow-ups with Infectious Disease and Psychiatry groups - Can contact these groups as well:  [phone] (psychiatry)      [phone] (infectious disease) - MyChart Video follow-up visit in 4 weeks - Contact us for questions between now and then

## 2021-05-09 NOTE — Assessment & Plan Note (Addendum)
Patient with history of longstanding hepatitis C, contracted through needle sharing.  He is currently asymptomatic, is interested in definitive evaluation and management.  Physical examination findings today reveals no icterus, no GI symptoms, no ecchymosis, no hepatomegaly.  He is amenable to referral to infectious disease, one was coordinated through our office today.  We will follow peripherally.

## 2021-05-23 ENCOUNTER — Other Ambulatory Visit: Payer: Self-pay

## 2021-05-23 ENCOUNTER — Ambulatory Visit: Payer: Medicaid Other | Attending: Infectious Diseases | Admitting: Infectious Diseases

## 2021-05-23 ENCOUNTER — Other Ambulatory Visit
Admission: RE | Admit: 2021-05-23 | Discharge: 2021-05-23 | Disposition: A | Discharge status: Home / Self Care | Payer: Medicaid Other | Source: Ambulatory Visit | Attending: Infectious Diseases | Admitting: Infectious Diseases

## 2021-05-23 ENCOUNTER — Encounter: Payer: Self-pay | Admitting: Infectious Diseases

## 2021-05-23 VITALS — BP 145/71 | HR 83 | Resp 16 | Ht 71.0 in | Wt 194.0 lb

## 2021-05-23 DIAGNOSIS — B182 Chronic viral hepatitis C: Secondary | ICD-10-CM | POA: Insufficient documentation

## 2021-05-23 DIAGNOSIS — F17210 Nicotine dependence, cigarettes, uncomplicated: Secondary | ICD-10-CM | POA: Diagnosis not present

## 2021-05-23 DIAGNOSIS — Z79899 Other long term (current) drug therapy: Secondary | ICD-10-CM | POA: Diagnosis not present

## 2021-05-23 LAB — CBC WITH DIFFERENTIAL/PLATELET
Abs Immature Granulocytes: 0.02 10*3/uL (ref 0.00–0.07)
Basophils Absolute: 0 10*3/uL (ref 0.0–0.1)
Basophils Relative: 0 %
Eosinophils Absolute: 0.1 10*3/uL (ref 0.0–0.5)
Eosinophils Relative: 1 %
HCT: 45.8 % (ref 39.0–52.0)
Hemoglobin: 15.6 g/dL (ref 13.0–17.0)
Immature Granulocytes: 0 %
Lymphocytes Relative: 27 %
Lymphs Abs: 2.2 10*3/uL (ref 0.7–4.0)
MCH: 30.6 pg (ref 26.0–34.0)
MCHC: 34.1 g/dL (ref 30.0–36.0)
MCV: 90 fL (ref 80.0–100.0)
Monocytes Absolute: 0.5 10*3/uL (ref 0.1–1.0)
Monocytes Relative: 6 %
Neutro Abs: 5.4 10*3/uL (ref 1.7–7.7)
Neutrophils Relative %: 66 %
Platelets: 246 10*3/uL (ref 150–400)
RBC: 5.09 MIL/uL (ref 4.22–5.81)
RDW: 13.4 % (ref 11.5–15.5)
WBC: 8.3 10*3/uL (ref 4.0–10.5)
nRBC: 0 % (ref 0.0–0.2)

## 2021-05-23 LAB — COMPREHENSIVE METABOLIC PANEL
ALT: 34 U/L (ref 0–44)
AST: 25 U/L (ref 15–41)
Albumin: 4.5 g/dL (ref 3.5–5.0)
Alkaline Phosphatase: 64 U/L (ref 38–126)
Anion gap: 10 (ref 5–15)
BUN: 12 mg/dL (ref 6–20)
CO2: 23 mmol/L (ref 22–32)
Calcium: 9.5 mg/dL (ref 8.9–10.3)
Chloride: 104 mmol/L (ref 98–111)
Creatinine, Ser: 0.85 mg/dL (ref 0.61–1.24)
GFR, Estimated: >60 mL/min (ref >60)
Glucose, Bld: 92 mg/dL (ref 70–99)
Potassium: 3.7 mmol/L (ref 3.5–5.1)
Sodium: 137 mmol/L (ref 135–145)
Total Bilirubin: 0.7 mg/dL (ref 0.3–1.2)
Total Protein: 7.6 g/dL (ref 6.5–8.1)

## 2021-05-23 LAB — PROTIME-INR
INR: 1 (ref 0.8–1.2)
Prothrombin Time: 13.4 seconds (ref 11.4–15.2)

## 2021-05-23 LAB — APTT: aPTT: 29 seconds (ref 24–36)

## 2021-05-23 LAB — TSH: TSH: 0.513 u[IU]/mL (ref 0.350–4.500)

## 2021-05-23 LAB — HEPATITIS B SURFACE ANTIGEN: Hepatitis B Surface Ag: NONREACTIVE

## 2021-05-23 LAB — HIV ANTIBODY (ROUTINE TESTING W REFLEX): HIV Screen 4th Generation wRfx: NONREACTIVE

## 2021-05-23 LAB — HEPATITIS C ANTIBODY: HCV Ab: REACTIVE — AB

## 2021-05-23 NOTE — Progress Notes (Signed)
NAME: Dale Graham  DOB: October 02, 1986  MRN: [id number]  Date/Time: 05/23/2021 9:53 AM  Subjective:   ? Dale Graham is a 35 y.o. male with no significant past medical history is referred to me for hepatitis C. 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 He wants to get treated for hepatitis C Since stopping heroin he has felt very anxious.  He has had trouble getting out of his house. He lives with his fiance and 13-month-old son. He has no history of nausea or vomiting or diarrhea or abdominal pain or icterus.  Past Medical History:  Diagnosis Date   Hepatitis-C    Substance abuse (HCC)     Past Surgical History:  Procedure Laterality Date   ROOT CANAL Left 2015    Social History   Socioeconomic History   Marital status: Single    Spouse name: Not on file   Number of children: 1   Years of education: 12+   Highest education level: Some college, no degree  Occupational History   Occupation: Copywriter, advertising  Tobacco Use   Smoking status: Some Days    Packs/day: 0.50    Years: 10.00    Pack years: 5.00    Types: Cigarettes   Smokeless tobacco: Former  Building services engineer Use: Former  Substance and Sexual Activity   Alcohol use: Not Currently   Drug use: Not Currently    Types: Heroin   Sexual activity: Yes    Partners: Female    Birth control/protection: None  Other Topics Concern   Not on file  Social History Narrative   Not on file   Social Determinants of Health   Financial Resource Strain: Not on file  Food Insecurity: Not on file  Transportation Needs: Not on file  Physical Activity: Not on file  Stress: Not on file  Social Connections: Not on file  Intimate Partner Violence: Not on file    Family History  Problem Relation Age of Onset   Diabetes Father    Asthma Son    Premature birth Son    Diabetes Maternal Grandmother    Heart disease Maternal Grandfather    No Known Allergies I? Current Outpatient  Medications  Medication Sig Dispense Refill   citalopram (CELEXA) 20 MG tablet Take 1 tablet (20 mg total) by mouth daily. 30 tablet 3   No current facility-administered medications for this visit.     Abtx:  Anti-infectives (From admission, onward)    None       REVIEW OF SYSTEMS:  Const: negative fever, negative chills, negative weight loss Eyes: negative diplopia or visual changes, negative eye pain ENT: negative coryza, negative sore throat Resp: negative cough, hemoptysis, dyspnea Cards: negative for chest pain, palpitations, lower extremity edema GU: negative for frequency, dysuria and hematuria GI: Negative for abdominal pain, diarrhea, bleeding, constipation Skin: negative for rash and pruritus Heme: negative for easy bruising and gum/nose bleeding MS: negative for myalgias, arthralgias, back pain and muscle weakness Neurolo:negative for headaches, dizziness, vertigo, memory problems  Psych:  anxiety, depression  Endocrine: negative for thyroid, diabetes Allergy/Immunology- negative for any medication or food allergies ?  Objective:  VITALS:  BP (!) 145/71   Pulse 83   Resp 16   Ht 5\' 11"  (1.803 m)   Wt 194 lb (88 kg)   SpO2 97%   BMI 27.06 kg/m  PHYSICAL EXAM:  General: Alert, cooperative, no distress, appears stated  age.  Head: Normocephalic, without obvious abnormality, atraumatic. Eyes: Conjunctivae clear, anicteric sclerae. Pupils are equal ENT Nares normal. No drainage or sinus tenderness. Lips, mucosa, and tongue normal. No Thrush Neck: Supple, symmetrical, no adenopathy, thyroid: non tender no carotid bruit and no JVD. Back: No CVA tenderness. Lungs: Clear to auscultation bilaterally. No Wheezing or Rhonchi. No rales. Heart: Regular rate and rhythm, no murmur, rub or gallop. Abdomen: Soft, non-tender,not distended. Bowel sounds normal. No masses Extremities: atraumatic, no cyanosis. No edema. No clubbing Skin: No rashes or lesions. Or  bruising Lymph: Cervical, supraclavicular normal. Neurologic: Grossly non-focal Pertinent Labs Lab Results CBC Tests result DATE comment  HEPC RNA     HEPC  Genotype     Hepatitis B profile     Hepatitis A status     PT/PTT     AST, ALT, Bilirubin     Albumin     creatinine     HB/Platelet     HIV     AFP     TSH     comorbidities      tobacco     alcohol     drug     APRI Score     FIB 4 score     Current meds     Ultrasound Elastography                 Impression/Recommendation ? Hepatitis C with no evidence of decompensation. No cirrhosis no ascites on clinical examination.  We will get labs today including HIV, hep C genotype, hep C RNA, LFTs, FibroSure and ultrasound elastography. Based on the above results.  Treatment for him.  IV drug user patient has been clean for the last 51 days.  He is highly motivated.  He needs to go for a court mandated drug program and is expected to take Vivitrol which is naltrexone injection long-acting once a month injection., ? __We will see him in 2 weeks with the results and discuss treatment.  _________________________________________________  Note:  This document was prepared using Dragon voice recognition software and may include unintentional dictation errors.

## 2021-05-23 NOTE — Patient Instructions (Signed)
You are here for [facility] care- today we will do labs and order ultrasound- after that we will discuss treatment

## 2021-05-24 LAB — HCV RNA QUANT
HCV Quantitative Log: 5.903 log10 IU/mL (ref >1.70)
HCV Quantitative: 799000 IU/mL (ref >50)

## 2021-05-24 LAB — AFP TUMOR MARKER: AFP, Serum, Tumor Marker: 2.5 ng/mL (ref 0.0–6.9)

## 2021-05-25 ENCOUNTER — Telehealth: Payer: Self-pay

## 2021-05-25 LAB — HCV FIBROSURE
ALPHA 2-MACROGLOBULINS, QN: 122 mg/dL (ref 110–276)
ALT (SGPT) P5P: 37 IU/L (ref 0–55)
Apolipoprotein A-1: 132 mg/dL (ref 101–178)
Bilirubin, Total: 0.4 mg/dL (ref 0.0–1.2)
Fibrosis Score: 0.05 (ref 0.00–0.21)
GGT: 20 IU/L (ref 0–65)
Haptoglobin: 135 mg/dL (ref 17–317)
Necroinflammat Activity Score: 0.14 (ref 0.00–0.17)

## 2021-05-25 LAB — HEPATITIS B SURFACE ANTIBODY, QUANTITATIVE: Hep B S AB Quant (Post): <3.1 m[IU]/mL — ABNORMAL LOW (ref >9.9)

## 2021-05-25 NOTE — Telephone Encounter (Signed)
Scheduled Ultrasound for 06/13/21 arrive 9:30 am for 10 am appt. Advised patient and moved the 8/16 follow up with [facility] ID to 06/15/21. Patient also given instructions on where to go for both appointments.

## 2021-05-31 LAB — HEPATITIS C GENOTYPE

## 2021-06-06 ENCOUNTER — Ambulatory Visit: Payer: Medicaid Other | Admitting: Infectious Diseases

## 2021-06-13 ENCOUNTER — Other Ambulatory Visit: Payer: Self-pay

## 2021-06-13 ENCOUNTER — Ambulatory Visit
Admission: RE | Admit: 2021-06-13 | Discharge: 2021-06-13 | Disposition: A | Discharge status: Home / Self Care | Payer: Medicaid Other | Source: Ambulatory Visit | Attending: Infectious Diseases | Admitting: Infectious Diseases

## 2021-06-13 DIAGNOSIS — K7689 Other specified diseases of liver: Secondary | ICD-10-CM | POA: Diagnosis not present

## 2021-06-13 DIAGNOSIS — B182 Chronic viral hepatitis C: Secondary | ICD-10-CM | POA: Diagnosis not present

## 2021-06-13 DIAGNOSIS — B1920 Unspecified viral hepatitis C without hepatic coma: Secondary | ICD-10-CM | POA: Diagnosis not present

## 2021-06-15 ENCOUNTER — Telehealth: Payer: Self-pay

## 2021-06-15 ENCOUNTER — Other Ambulatory Visit (HOSPITAL_COMMUNITY): Payer: Self-pay

## 2021-06-15 ENCOUNTER — Other Ambulatory Visit: Payer: Self-pay

## 2021-06-15 ENCOUNTER — Encounter: Payer: Self-pay | Admitting: Infectious Diseases

## 2021-06-15 ENCOUNTER — Ambulatory Visit: Payer: Medicaid Other | Attending: Infectious Diseases | Admitting: Infectious Diseases

## 2021-06-15 VITALS — BP 131/87 | HR 95 | Resp 16 | Ht 71.0 in | Wt 194.0 lb

## 2021-06-15 DIAGNOSIS — Z8249 Family history of ischemic heart disease and other diseases of the circulatory system: Secondary | ICD-10-CM | POA: Diagnosis not present

## 2021-06-15 DIAGNOSIS — F17210 Nicotine dependence, cigarettes, uncomplicated: Secondary | ICD-10-CM | POA: Insufficient documentation

## 2021-06-15 DIAGNOSIS — B1920 Unspecified viral hepatitis C without hepatic coma: Secondary | ICD-10-CM | POA: Insufficient documentation

## 2021-06-15 DIAGNOSIS — Z79899 Other long term (current) drug therapy: Secondary | ICD-10-CM | POA: Insufficient documentation

## 2021-06-15 DIAGNOSIS — B182 Chronic viral hepatitis C: Secondary | ICD-10-CM | POA: Diagnosis not present

## 2021-06-15 DIAGNOSIS — F1911 Other psychoactive substance abuse, in remission: Secondary | ICD-10-CM | POA: Insufficient documentation

## 2021-06-15 MED ORDER — SOFOSBUVIR-VELPATASVIR 400-100 MG PO TABS
1.0000 | ORAL_TABLET | Freq: Every day | ORAL | 2 refills | Status: AC
  Filled 2021-06-15: qty 56, 56d supply, fill #0
  Filled 2021-06-16: qty 28, 28d supply, fill #0
  Filled 2021-07-15: qty 28, 28d supply, fill #1
  Filled 2021-08-25: qty 28, 28d supply, fill #2
  Filled 2022-06-05 – 2022-06-08 (×2): qty 28, 28d supply, fill #3

## 2021-06-15 NOTE — Telephone Encounter (Signed)
[facility] Patient Advocate Encounter   Received notification from [facility] [place] that prior authorization for [name] is required.   PA submitted on 06/15/21 Key [id number] Status is pending    [facility] will continue to follow.   Clearance Coots, CPhT Specialty Pharmacy Patient [facility] for Infectious Disease Phone: [phone] Fax:  [phone]

## 2021-06-15 NOTE — Progress Notes (Signed)
NAME: Dale Graham  DOB: [birth year]  MRN: [id number]  Date/Time: 06/15/2021 11:24 AM  Subjective:   ?follow up visit for hep c Last seen on 05/23/21 and here to discuss labs and discuss treatment He is doing well No complaints No IV DA for  70 days Following taken from last note Dale Graham is a 35 y.o. male with no significant past medical history is referred to me for hepatitis C. 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 He wants to get treated for hepatitis C Since stopping heroin he has felt very anxious.  He has had trouble getting out of his house. He lives with his fiance and 35-month-old son. He has no history of nausea or vomiting or diarrhea or abdominal pain or icterus.  Past Medical History:  Diagnosis Date   Hepatitis-C    Substance abuse (HCC)     Past Surgical History:  Procedure Laterality Date   ROOT CANAL Left 2015    Social History   Socioeconomic History   Marital status: Single    Spouse name: Not on file   Number of children: 1   Years of education: 12+   Highest education level: Some college, no degree  Occupational History   Occupation: Copywriter, advertising  Tobacco Use   Smoking status: Some Days    Packs/day: 0.50    Years: 10.00    Pack years: 5.00    Types: Cigarettes   Smokeless tobacco: Former  Building services engineer Use: Former  Substance and Sexual Activity   Alcohol use: Not Currently   Drug use: Not Currently    Types: Heroin   Sexual activity: Yes    Partners: Female    Birth control/protection: None  Other Topics Concern   Not on file  Social History Narrative   Not on file   Social Determinants of Health   Financial Resource Strain: Not on file  Food Insecurity: Not on file  Transportation Needs: Not on file  Physical Activity: Not on file  Stress: Not on file  Social Connections: Not on file  Intimate Partner Violence: Not on file    Family History  Problem Relation Age of Onset    Diabetes Father    Asthma Son    Premature birth Son    Diabetes Maternal Grandmother    Heart disease Maternal Grandfather    No Known Allergies I? Current Outpatient Medications  Medication Sig Dispense Refill   citalopram (CELEXA) 20 MG tablet Take 1 tablet (20 mg total) by mouth daily. 30 tablet 3   No current facility-administered medications for this visit.     Abtx:  Anti-infectives (From admission, onward)    None       REVIEW OF SYSTEMS:  Const: negative fever, negative chills, negative weight loss Eyes: negative diplopia or visual changes, negative eye pain ENT: negative coryza, negative sore throat Resp: negative cough, hemoptysis, dyspnea Cards: negative for chest pain, palpitations, lower extremity edema GU: negative for frequency, dysuria and hematuria GI: Negative for abdominal pain, diarrhea, bleeding, constipation Skin: negative for rash and pruritus Heme: negative for easy bruising and gum/nose bleeding MS: negative for myalgias, arthralgias, back pain and muscle weakness Neurolo:negative for headaches, dizziness, vertigo, memory problems  Psych:  anxiety, depression  Endocrine: negative for thyroid, diabetes Allergy/Immunology- negative for any medication or food allergies ?  Objective:  VITALS:  BP 131/87   Pulse 95   Resp 16  Ht 5\' 11"  (1.803 m)   Wt 194 lb (88 kg)   SpO2 98%   BMI 27.06 kg/m   PHYSICAL EXAM:  General: looks well.  Head: Normocephalic, without obvious abnormality, atraumatic. Eyes: Conjunctivae clear, anicteric sclerae. Pupils are equal ENT Nares normal. No drainage or sinus tenderness. Lips, mucosa, and tongue normal. No Thrush Neck: Supple, symmetrical, no adenopathy, thyroid: non tender no carotid bruit and no JVD. Back: No CVA tenderness. Lungs: Clear to auscultation bilaterally. No Wheezing or Rhonchi. No rales. Heart: Regular rate and rhythm, no murmur, rub or gallop. Abdomen: Soft, non-tender,not distended.  Bowel sounds normal. No masses Extremities: atraumatic, no cyanosis. No edema. No clubbing Skin: No rashes or lesions. Or bruising Lymph: Cervical, supraclavicular normal. Neurologic: Grossly non-focal Pertinent Labs Lab Results CBC Tests result DATE comment  HEPC RNA 799,000 05/23/21   HEPC  Genotype 1a    Hepatitis B profile NR    Hepatitis A status     PT/PTT 13.4/29    AST, ALT, Bilirubin 25/34/0.7    Albumin 4.5    creatinine 0.85    HB/Platelet     HIV NR    AFP 2.5    TSH 0.513    comorbidities      tobacco     alcohol     drug     APRI Score     FIB 4 score     Current meds     Ultrasound Elastography <5    Fibrosure F=0 A=0           Impression/Recommendation ? Hepatitis C with no evidence of decompensation. No cirrhosis no ascites on clinical examination.  And by labs and 04-02-1986 Will start epclusa- needs PA- sent prescription to [facility] specialty pharmacy  IV drug user patient has been clean for the last 70 days.  He is highly motivated.  He needs to go for a court mandated drug program and is expected to take Vivitrol which is naltrexone injection long-acting once a month injection., ? __We will see him in 4 weeks _________________________________________________  Note:  This document was prepared using Dragon voice recognition software and may include unintentional dictation errors.

## 2021-06-15 NOTE — Patient Instructions (Addendum)
Today you are here to discuss treatment for hepc- your labs look good- liver looks good- will get Prior authorization for the medication called Epclusa which you will take for 3 months. It will be dispensed from [facility]. They will cll you once ready and mail it to your house

## 2021-06-15 NOTE — Telephone Encounter (Signed)
[facility] Patient Advocate Encounter  Prior Authorization for Dale Graham has been approved.    PA# [id number] Effective dates: 06/15/21 through 09/07/21  Patients co-pay is $4.00.   [facility] will continue to follow.  Clearance Coots, CPhT Specialty Pharmacy Patient [facility] Phone: [phone] Fax:  [phone]

## 2021-06-16 ENCOUNTER — Other Ambulatory Visit (HOSPITAL_COMMUNITY): Payer: Self-pay

## 2021-06-16 ENCOUNTER — Telehealth: Payer: Self-pay | Admitting: Pharmacist

## 2021-06-16 NOTE — Telephone Encounter (Signed)
I offered mailing, and he decided to pick up in person. I did give him the number to call if he changed his mind.

## 2021-06-16 NOTE — Telephone Encounter (Signed)
I was able to get in touch with him. He will pick up today or tomorrow.

## 2021-06-16 NOTE — Telephone Encounter (Signed)
Patient is approved to receive Epclusa x 12 weeks for chronic Hepatitis C infection. Counseled patient to take Epclusa daily with or without food. Encouraged patient not to miss any doses and explained how their chance of cure could go down with each dose missed. Counseled patient on what to do if dose is missed - if it is closer to the missed dose take immediately; if closer to next dose skip dose and take the next dose at the usual time.   Counseled patient on common side effects such as headache, fatigue, and nausea and that these normally decrease with time. I reviewed patient medications and found no drug interactions. Discussed with patient that there are several drug interactions including acid suppressants. Instructed patient to call clinic if he wishes to start a new medication during course of therapy. Also advised patient to call if he experiences any side effects. Patient will follow-up with Dr. Rivka Safer in 4 weeks.

## 2021-06-19 ENCOUNTER — Other Ambulatory Visit (HOSPITAL_COMMUNITY): Payer: Self-pay

## 2021-06-22 ENCOUNTER — Other Ambulatory Visit: Payer: Self-pay

## 2021-06-22 ENCOUNTER — Telehealth (INDEPENDENT_AMBULATORY_CARE_PROVIDER_SITE_OTHER): Payer: Medicaid Other | Admitting: Family Medicine

## 2021-06-22 ENCOUNTER — Telehealth: Payer: Self-pay

## 2021-06-22 ENCOUNTER — Encounter: Payer: Self-pay | Admitting: Family Medicine

## 2021-06-22 VITALS — Ht 71.0 in | Wt 191.0 lb

## 2021-06-22 DIAGNOSIS — F411 Generalized anxiety disorder: Secondary | ICD-10-CM | POA: Diagnosis not present

## 2021-06-22 DIAGNOSIS — B1920 Unspecified viral hepatitis C without hepatic coma: Secondary | ICD-10-CM | POA: Diagnosis not present

## 2021-06-22 MED ORDER — CITALOPRAM HYDROBROMIDE 40 MG PO TABS: 40.0000 mg | ORAL_TABLET | Freq: Every day | ORAL | 3 refills | Status: AC

## 2021-06-22 NOTE — Progress Notes (Signed)
Patient scheduled 07/20/21.

## 2021-06-22 NOTE — Assessment & Plan Note (Signed)
Patient has established with infectious disease, he has begun treatment course for hepatitis C, we will follow this issue peripherally.

## 2021-06-22 NOTE — Patient Instructions (Signed)
-   Start new citalopram (Celexa) 40 mg dose once daily - Continue follow-up and treatment through infectious disease group - Return for follow-up to our office in 4 weeks - Contact us for any questions between now and then

## 2021-06-22 NOTE — Progress Notes (Signed)
Primary Care / Sports Medicine Virtual Visit  Patient Information:  Patient ID: Dale S Lisenby, male DOB: 07/29/1986 Age: 35 y.o. MRN: [id number]   Dale Graham is a pleasant 35 y.o. male presenting with the following:  Chief Complaint  Patient presents with   Follow-up   Anxiety    Taking celexa 20 mg daily with no relief per patient   Hepatitis C    Following with Dr. Rivka Safer; on [name] for treatment    Review of Systems: No fevers, chills, night sweats, weight loss, chest pain, or shortness of breath.   Patient Active Problem List   Diagnosis Date Noted   GAD (generalized anxiety disorder) 05/09/2021   Hepatitis C 11/27/2016   History of narcotic addiction (HCC) 05/01/2013   Past Medical History:  Diagnosis Date   Anxiety    Depression    Hepatitis-C    Substance abuse (HCC)    Outpatient Encounter Medications as of 06/22/2021  Medication Sig   citalopram (CELEXA) 40 MG tablet Take 1 tablet (40 mg total) by mouth daily.   Sofosbuvir-Velpatasvir (EPCLUSA) 400-100 MG TABS Take 1 tablet by mouth daily.   [DISCONTINUED] citalopram (CELEXA) 20 MG tablet Take 1 tablet (20 mg total) by mouth daily.   No facility-administered encounter medications on file as of 06/22/2021.   Past Surgical History:  Procedure Laterality Date   ROOT CANAL Left 2015    Virtual Visit via MyChart Video:   I connected with Dale S Sabedra on 06/22/21 via MyChart Video and verified that I am speaking with the correct person using appropriate identifiers.   The limitations, risks, security and privacy concerns of performing an evaluation and management service by MyChart Video, including the higher likelihood of inaccurate diagnoses and treatments, and the availability of in person appointments were reviewed. The possible need of an additional face-to-face encounter for complete and high quality delivery of care was discussed. The patient was also made aware that there may be a patient  responsible charge related to this service. The patient expressed understanding and wishes to proceed.  Provider location is in medical facility. Patient location is at their home, different from provider location. People involved in care of the patient during this telehealth encounter were myself, my nurse/medical assistant, and my front office/scheduling team member.  Objective findings:   General: Speaking full sentences, no audible heavy breathing. Sounds alert and appropriately interactive. Well-appearing. Face symmetric. Extraocular movements intact. Pupils equal and round. No nasal flaring or accessory muscle use visualized.  Independent interpretation of notes and tests performed by another provider:   None  Pertinent History, Exam, Impression, and Recommendations:   GAD (generalized anxiety disorder) Depression screen [facility] 2/9 06/22/2021 05/09/2021  Decreased Interest 3 3  Down, Depressed, Hopeless 2 3  PHQ - 2 Score 5 6  Altered sleeping 1 3  Tired, decreased energy 3 3  Change in appetite 3 3  Feeling bad or failure about yourself  1 3  Trouble concentrating 3 3  Moving slowly or fidgety/restless 3 3  Suicidal thoughts 0 0  PHQ-9 Score 19 24  Difficult doing work/chores Extremely dIfficult Extremely dIfficult   GAD 7 : Generalized Anxiety Score 06/22/2021 05/09/2021  Nervous, Anxious, on Edge 3 3  Control/stop worrying 2 3  Worry too much - different things 3 3  Trouble relaxing 2 3  Restless 2 3  Easily annoyed or irritable 2 3  Afraid - awful might happen 0 3  Total GAD 7  Score 14 21  Anxiety Difficulty Very difficult Extremely difficult   He has tolerated Celexa 20 mg well, noted interval decrease in PHQ and GAD scores.  We will further titrate this to 40 mg daily, prescription was sent to his pharmacy on file for him to begin.  We will follow-up on this issue during an in person visit in 1 month's time.  Hepatitis C Patient has established with infectious disease,  he has begun treatment course for hepatitis C, we will follow this issue peripherally.   Orders & Medications Meds ordered this encounter  Medications   citalopram (CELEXA) 40 MG tablet    Sig: Take 1 tablet (40 mg total) by mouth daily.    Dispense:  30 tablet    Refill:  3   No orders of the defined types were placed in this encounter.    I discussed the above assessment and treatment plan with the patient. The patient was provided an opportunity to ask questions and all were answered. The patient agreed with the plan and demonstrated an understanding of the instructions.   The patient was advised to call back or seek an in-person evaluation if the symptoms worsen or if the condition fails to improve as anticipated.   I provided a total time of 31 minutes including both face-to-face and non-face-to-face time on 06/22/2021 inclusive of time utilized for medical chart review, information gathering, care coordination with staff, and documentation completion.    Jerrol Banana, MD   Primary Care Sports Medicine [facility]

## 2021-06-22 NOTE — Assessment & Plan Note (Signed)
Depression screen [facility] 2/9 06/22/2021 05/09/2021  Decreased Interest 3 3  Down, Depressed, Hopeless 2 3  PHQ - 2 Score 5 6  Altered sleeping 1 3  Tired, decreased energy 3 3  Change in appetite 3 3  Feeling bad or failure about yourself  1 3  Trouble concentrating 3 3  Moving slowly or fidgety/restless 3 3  Suicidal thoughts 0 0  PHQ-9 Score 19 24  Difficult doing work/chores Extremely dIfficult Extremely dIfficult   GAD 7 : Generalized Anxiety Score 06/22/2021 05/09/2021  Nervous, Anxious, on Edge 3 3  Control/stop worrying 2 3  Worry too much - different things 3 3  Trouble relaxing 2 3  Restless 2 3  Easily annoyed or irritable 2 3  Afraid - awful might happen 0 3  Total GAD 7 Score 14 21  Anxiety Difficulty Very difficult Extremely difficult   He has tolerated Celexa 20 mg well, noted interval decrease in PHQ and GAD scores.  We will further titrate this to 40 mg daily, prescription was sent to his pharmacy on file for him to begin.  We will follow-up on this issue during an in person visit in 1 month's time.

## 2021-06-22 NOTE — Telephone Encounter (Signed)
I discussed the limitations, risks, security and privacy concerns of performing an evaluation and management service by WebEx/MyChart/Doximity Video, including the higher likelihood of inaccurate diagnosis and treatment, and the availability of in person appointments. We also discussed the likely need of an additional face to face encounter for complete and high quality delivery of care. I also discussed with the patient that there may be a patient responsible charge related to this service. The patient expressed understanding and wishes to proceed.  

## 2021-07-14 ENCOUNTER — Other Ambulatory Visit (HOSPITAL_COMMUNITY): Payer: Self-pay

## 2021-07-15 ENCOUNTER — Other Ambulatory Visit (HOSPITAL_COMMUNITY): Payer: Self-pay

## 2021-07-17 ENCOUNTER — Other Ambulatory Visit (HOSPITAL_COMMUNITY): Payer: Self-pay

## 2021-07-18 ENCOUNTER — Other Ambulatory Visit: Payer: Self-pay

## 2021-07-18 ENCOUNTER — Ambulatory Visit: Payer: Medicaid Other | Attending: Infectious Diseases | Admitting: Infectious Diseases

## 2021-07-18 VITALS — BP 138/79 | HR 78 | Resp 16 | Ht 71.0 in | Wt 191.0 lb

## 2021-07-18 DIAGNOSIS — B1920 Unspecified viral hepatitis C without hepatic coma: Secondary | ICD-10-CM | POA: Diagnosis not present

## 2021-07-18 DIAGNOSIS — Z79899 Other long term (current) drug therapy: Secondary | ICD-10-CM | POA: Insufficient documentation

## 2021-07-18 DIAGNOSIS — Z87891 Personal history of nicotine dependence: Secondary | ICD-10-CM | POA: Insufficient documentation

## 2021-07-18 DIAGNOSIS — Z23 Encounter for immunization: Secondary | ICD-10-CM | POA: Insufficient documentation

## 2021-07-18 DIAGNOSIS — F419 Anxiety disorder, unspecified: Secondary | ICD-10-CM | POA: Diagnosis not present

## 2021-07-18 DIAGNOSIS — F1911 Other psychoactive substance abuse, in remission: Secondary | ICD-10-CM | POA: Diagnosis not present

## 2021-07-18 DIAGNOSIS — B182 Chronic viral hepatitis C: Secondary | ICD-10-CM

## 2021-07-18 NOTE — Patient Instructions (Signed)
You are here for follow up of EPC- started treatment 4 weeks ago and doing well- will follow you in 2 months once you complete treatment to check labs

## 2021-07-18 NOTE — Addendum Note (Signed)
Addended by: Clayborne Artist A on: 07/18/2021 10:44 PM   Modules accepted: Orders

## 2021-07-18 NOTE — Progress Notes (Signed)
NAME: Dale Graham  DOB: 02/08/1986  MRN: [id number]  Date/Time: 07/18/2021 11:12 AM  Subjective:  Dale Graham is a 35 y.o. male with h/o hepatitis C ?follow up visit for hep c Last seen on 06/15/21 and started on hepc treatment - epclusa Has been taking the medication everyday No side effects Takes it at every evening NO nausea, no vomiting, no pain abdomen On Celexa - anxiety better Clean from IVDA 105 days Following taken from last note   Past Medical History:  Diagnosis Date   Anxiety    Depression    Hepatitis-C    Substance abuse (HCC)     Past Surgical History:  Procedure Laterality Date   ROOT CANAL Left 2015    Social History   Socioeconomic History   Marital status: Significant Other    Spouse name: Kieth Brightly   Number of children: 1   Years of education: 12+   Highest education level: Some college, no degree  Occupational History   Occupation: Copywriter, advertising  Tobacco Use   Smoking status: Some Days    Packs/day: 0.50    Years: 10.00    Pack years: 5.00    Types: Cigarettes   Smokeless tobacco: Former  Building services engineer Use: Former  Substance and Sexual Activity   Alcohol use: Not Currently   Drug use: Not Currently    Types: Heroin   Sexual activity: Yes    Partners: Female    Birth control/protection: None  Other Topics Concern   Not on file  Social History Narrative   Not on file   Social Determinants of Health   Financial Resource Strain: Not on file  Food Insecurity: Not on file  Transportation Needs: Not on file  Physical Activity: Not on file  Stress: Not on file  Social Connections: Not on file  Intimate Partner Violence: Not on file    Family History  Problem Relation Age of Onset   Diabetes Father    Asthma Son    Premature birth Son    Diabetes Maternal Grandmother    Heart disease Maternal Grandfather    No Known Allergies I? Current Outpatient Medications  Medication Sig Dispense Refill    citalopram (CELEXA) 40 MG tablet Take 1 tablet (40 mg total) by mouth daily. 30 tablet 3   Sofosbuvir-Velpatasvir (EPCLUSA) 400-100 MG TABS Take 1 tablet by mouth daily. 30 tablet 2   No current facility-administered medications for this visit.     Abtx:  Anti-infectives (From admission, onward)    None       REVIEW OF SYSTEMS:  Const: negative fever, negative chills, negative weight loss Eyes: negative diplopia or visual changes, negative eye pain ENT: negative coryza, negative sore throat Resp: negative cough, hemoptysis, dyspnea Cards: negative for chest pain, palpitations, lower extremity edema GU: negative for frequency, dysuria and hematuria GI: Negative for abdominal pain, diarrhea, bleeding, constipation Skin: negative for rash and pruritus Heme: negative for easy bruising and gum/nose bleeding MS: negative for myalgias, arthralgias, back pain and muscle weakness Neurolo:negative for headaches, dizziness, vertigo, memory problems  Psych:  anxiety, depression  Endocrine: negative for thyroid, diabetes Allergy/Immunology- negative for any medication or food allergies ?  Objective:  VITALS:  BP 138/79   Pulse 78   Resp 16   Ht 5\' 11"  (1.803 m)   Wt 191 lb (86.6 kg)   SpO2 98%   BMI 26.64 kg/m   PHYSICAL EXAM:  General: well, no distress  Head: Normocephalic, without obvious abnormality, atraumatic. Eyes: Conjunctivae clear, anicteric sclerae. Pupils are equal ENT Nares normal. No drainage or sinus tenderness. Lips, mucosa, and tongue normal. No Thrush Neck: Supple, symmetrical, no adenopathy, thyroid: non tender no carotid bruit and no JVD. Back: No CVA tenderness. Lungs: Clear to auscultation bilaterally. No Wheezing or Rhonchi. No rales. Heart: Regular rate and rhythm, no murmur, rub or gallop. Abdomen: Soft, non-tender,not distended. Bowel sounds normal. No masses Extremities: atraumatic, no cyanosis. No edema. No clubbing Skin: No rashes or lesions. Or  bruising Lymph: Cervical, supraclavicular normal. Neurologic: Grossly non-focal Pertinent Labs Lab Results CBC Tests result DATE comment  HEPC RNA 799,000 05/23/21   HEPC  Genotype 1a    Hepatitis B profile NR    Hepatitis A status     PT/PTT 13.4/29    AST, ALT, Bilirubin 25/34/0.7    Albumin 4.5    creatinine 0.85    HB/Platelet     HIV NR    AFP 2.5    TSH 0.513    comorbidities      tobacco     alcohol     drug     APRI Score     FIB 4 score     Current meds     Ultrasound Elastography <5    Fibrosure F=0 A=0           Impression/Recommendation ? Hepatitis C with no evidence of decompensation. .  On Epclusa- - started sept 2022- doing well- 100% adherent  Will get HEPC RNA once he completes treatment Will give a dose of HEPb vaccine today ( < 3 HBSAB had hepb vaccine series as a child)  IV drug user patient has been clean for the last 105 days.   He is going for a court mandated program [employer] -- Vivitrol which is naltrexone injection long-acting once a month injection may be given. He will take Epclusa with him ?Discussed the management with the patient Follow up 8 weeks _________________________________________________

## 2021-07-20 ENCOUNTER — Ambulatory Visit: Payer: Medicaid Other | Admitting: Family Medicine

## 2021-08-09 ENCOUNTER — Ambulatory Visit: Payer: Medicaid Other | Admitting: Family Medicine

## 2021-08-10 ENCOUNTER — Other Ambulatory Visit (HOSPITAL_COMMUNITY): Payer: Self-pay

## 2021-08-14 ENCOUNTER — Other Ambulatory Visit (HOSPITAL_COMMUNITY): Payer: Self-pay

## 2021-08-18 ENCOUNTER — Other Ambulatory Visit (HOSPITAL_COMMUNITY): Payer: Self-pay

## 2021-08-25 ENCOUNTER — Other Ambulatory Visit (HOSPITAL_COMMUNITY): Payer: Self-pay

## 2021-09-08 ENCOUNTER — Other Ambulatory Visit (HOSPITAL_COMMUNITY): Payer: Self-pay

## 2021-09-12 ENCOUNTER — Ambulatory Visit: Payer: Medicaid Other | Admitting: Infectious Diseases

## 2021-09-18 ENCOUNTER — Other Ambulatory Visit (HOSPITAL_COMMUNITY): Payer: Self-pay

## 2021-09-19 ENCOUNTER — Other Ambulatory Visit (HOSPITAL_COMMUNITY): Payer: Self-pay

## 2021-09-26 IMAGING — US US ABDOMEN COMPLETE W/ ELASTOGRAPHY
1 series · 12 of 25 positions shown · non-contrast
Comparison: None.

CLINICAL DATA: Hepatitis-C

EXAM:
ULTRASOUND ABDOMEN
ULTRASOUND HEPATIC ELASTOGRAPHY
TECHNIQUE: Sonography of the upper abdomen was performed. In addition,
ultrasound elastography evaluation of the liver was performed. A
region of interest was placed within the right lobe of the liver.
Following application of a compressive sonographic pulse, tissue
compressibility was assessed. Multiple assessments were performed at
the selected site. Median tissue compressibility was determined.
Previously, hepatic stiffness was assessed by shear wave velocity.
Based on recently published Society of Radiologists in Ultrasound
consensus article, reporting is now recommended to be performed in
the SI units of pressure (kiloPascals) representing hepatic
stiffness/elasticity. The obtained result is compared to the
published reference standards. (cACLD = compensated Advanced Chronic
Liver Disease)

[Series 1: us abdomen complete w/elastography · 12 of 106 slices shown]
[im 5/106]
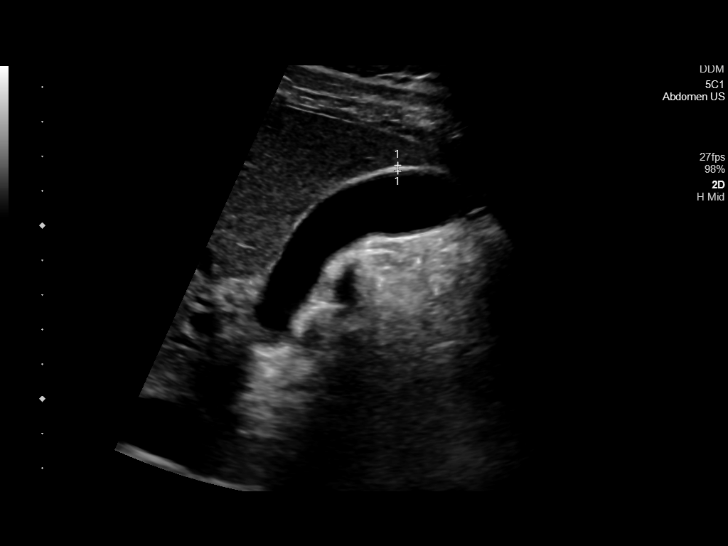
[im 14/106]
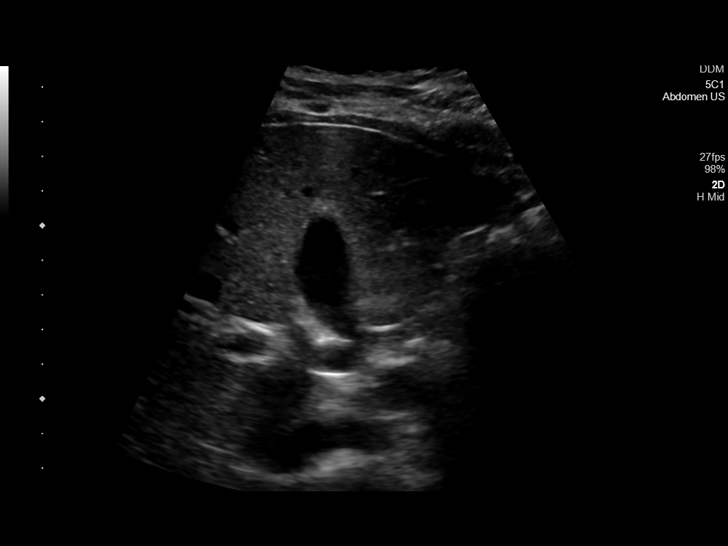
[im 22/106]
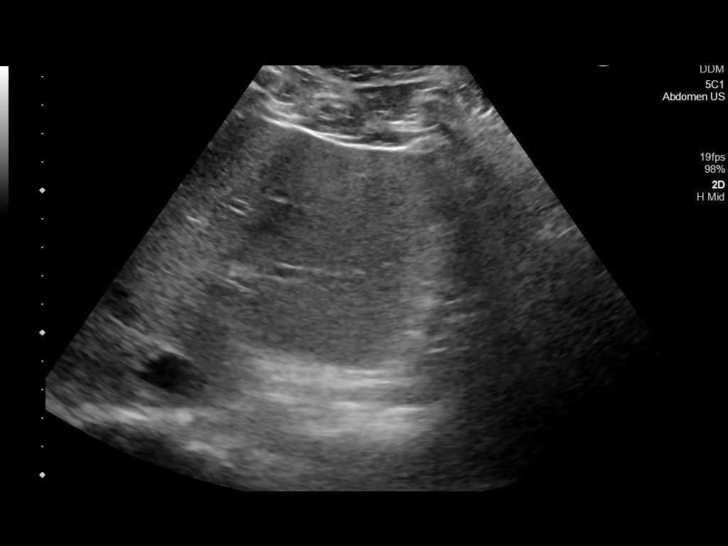
[im 31/106]
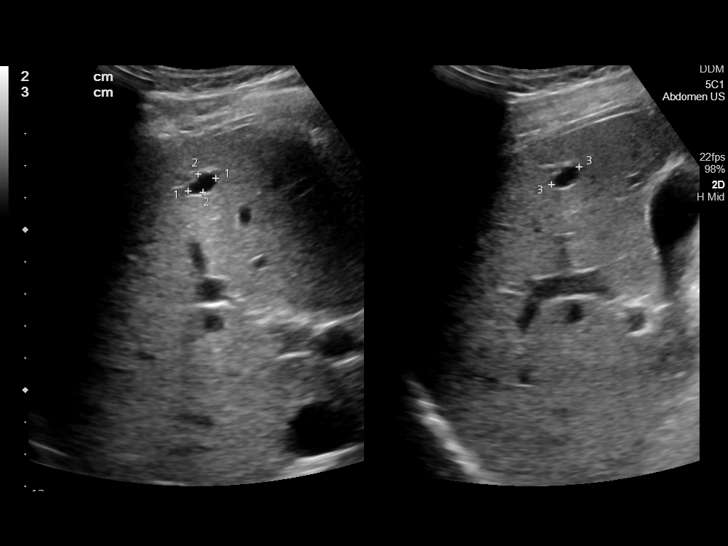
[im 40/106]
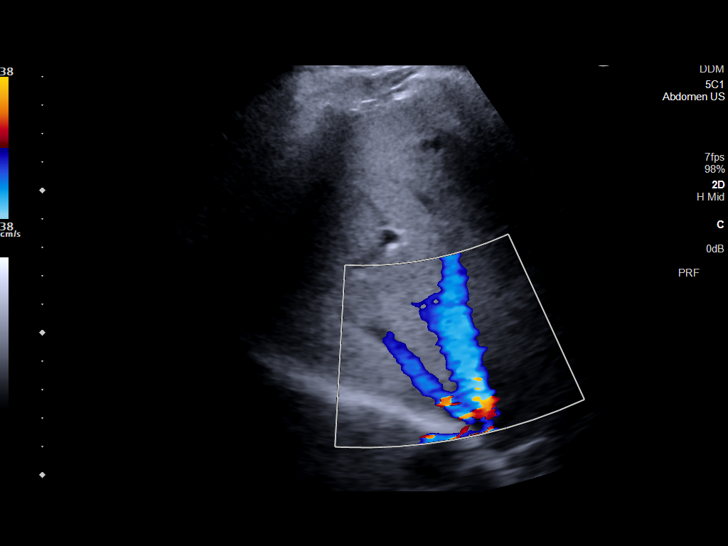
[im 49/106]
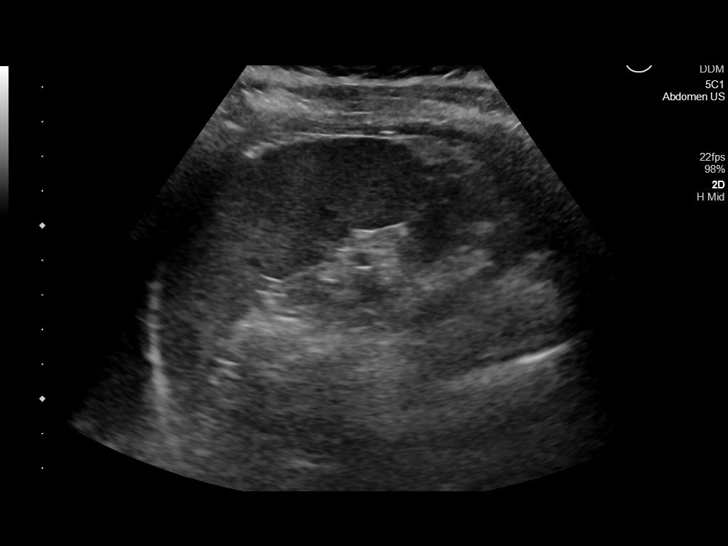
[im 57/106]
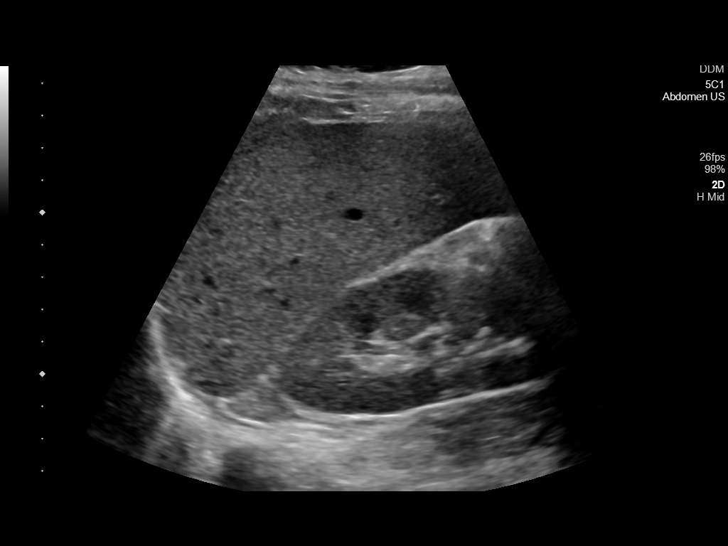
[im 66/106]
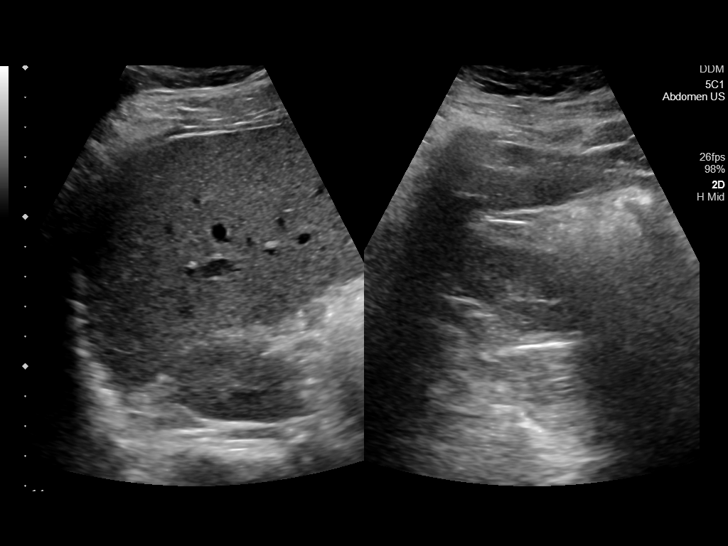
[im 75/106]
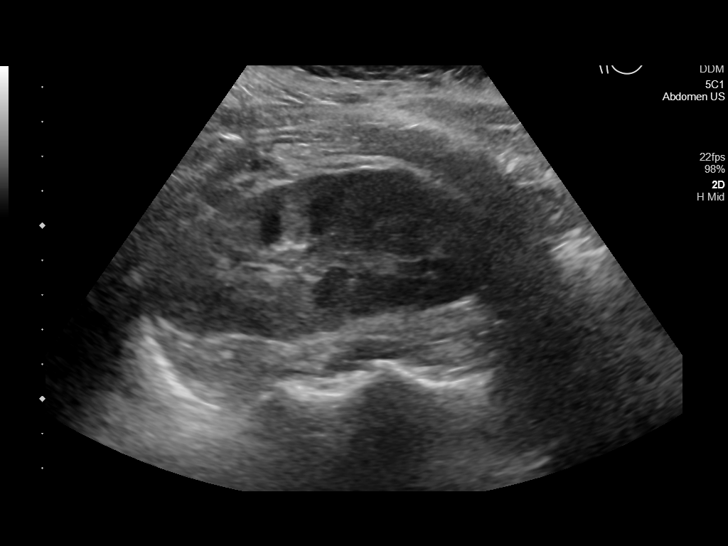
[im 84/106]
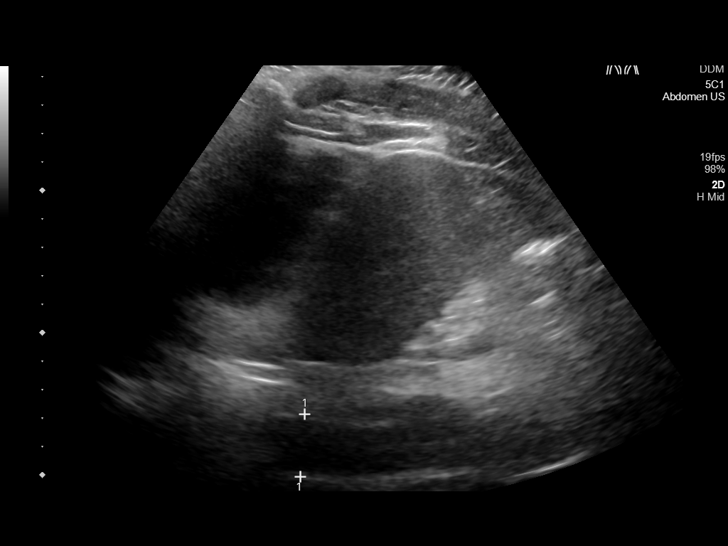
[im 92/106]
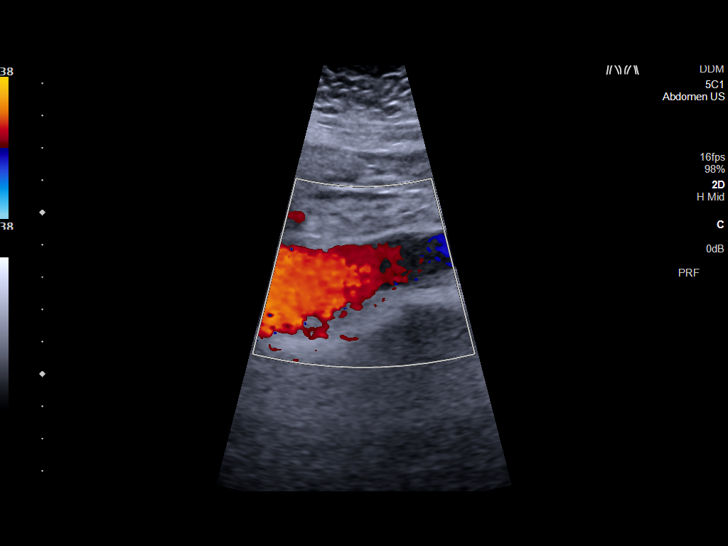
[im 101/106]
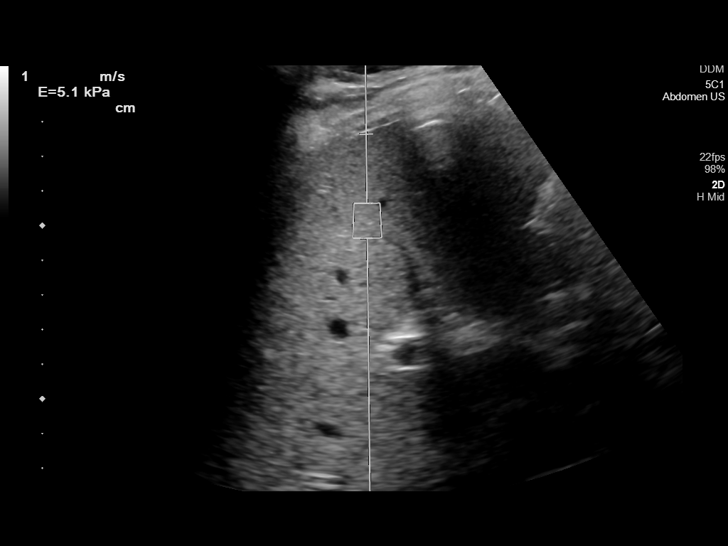

[12 of 25 positions shown; findings below may reference images not displayed]

FINDINGS: ULTRASOUND ABDOMEN

Gallbladder: No gallstones or wall thickening visualized. No
sonographic Murphy sign noted by sonographer.

Common bile duct: Diameter: Normal caliber, 2 mm

Liver: Normal echotexture. 1 cm cyst in the right hepatic lobe. No
biliary ductal dilatation. Portal vein is patent on color Doppler
imaging with normal direction of blood flow towards the liver.

IVC: No abnormality visualized.

Pancreas: Visualized portion unremarkable.

Spleen: Size and appearance within normal limits.

Right Kidney: Length: 10.6 cm. Echogenicity within normal limits. No
mass or hydronephrosis visualized.

Left Kidney: Length: 10.2 cm. Echogenicity within normal limits. No
mass or hydronephrosis visualized.

Abdominal aorta: No aneurysm visualized.

Other findings: None.

ULTRASOUND HEPATIC ELASTOGRAPHY

Device: Siemens Helix VTQ

Patient position: Supine

Transducer 6C1

Number of measurements: 10

Hepatic segment:  8

Median kPa:

IQR:

IQR/Median kPa ratio:

Data quality:  Good

Diagnostic category:  < or = 5 kPa: high probability of being normal

The use of hepatic elastography is applicable to patients with viral
hepatitis and non-alcoholic fatty liver disease. At this time, there
is insufficient data for the referenced cut-off values and use in
other causes of liver disease, including alcoholic liver disease.
Patients, however, may be assessed by elastography and serve as
their own reference standard/baseline.

In patients with non-alcoholic liver disease, the values suggesting
compensated advanced chronic liver disease (cACLD) may be lower, and
patients may need additional testing with elasticity results of [DATE]
kPa.

Please note that abnormal hepatic elasticity and shear wave
velocities may also be identified in clinical settings other than
with hepatic fibrosis, such as: acute hepatitis, elevated right
heart and central venous pressures including use of beta blockers,
Kate disease ([place]), infiltrative processes such as
mastocytosis/amyloidosis/infiltrative tumor/lymphoma, extrahepatic
cholestasis, with hyperemia in the post-prandial state, and with
liver transplantation. Correlation with patient history, laboratory
data, and clinical condition recommended.

Diagnostic Categories:

< or =5 kPa: high probability of being normal

< or =9 kPa: in the absence of other known clinical signs, rules [DATE] kPa and ?13 kPa: suggestive of cACLD, but needs further testing

>13 kPa: highly suggestive of cACLD

> or =17 kPa: highly suggestive of cACLD with an increased
probability of clinically significant portal hypertension
IMPRESSION: ULTRASOUND ABDOMEN:

No acute findings

ULTRASOUND HEPATIC ELASTOGRAPHY:

Median kPa:

Diagnostic category:  < or = 5 kPa: high probability of being normal

## 2022-06-05 ENCOUNTER — Other Ambulatory Visit (HOSPITAL_COMMUNITY): Payer: Self-pay

## 2022-06-08 ENCOUNTER — Other Ambulatory Visit (HOSPITAL_COMMUNITY): Payer: Self-pay

## 2022-07-23 ENCOUNTER — Ambulatory Visit: Payer: Self-pay | Admitting: *Deleted

## 2022-07-23 NOTE — Telephone Encounter (Signed)
Pt stated cut thumb on auger at work. Stated his boss was calling, on hold or disconnected from call. Attempted to reach pt, called back, "Call cannot be completed at this time."

## 2022-10-06 ENCOUNTER — Emergency Department (HOSPITAL_BASED_OUTPATIENT_CLINIC_OR_DEPARTMENT_OTHER)
Admission: EM | Admit: 2022-10-06 | Discharge: 2022-10-07 | Disposition: A | Discharge status: Home / Self Care | Payer: Medicaid Other | Attending: Emergency Medicine | Admitting: Emergency Medicine

## 2022-10-06 ENCOUNTER — Encounter (HOSPITAL_BASED_OUTPATIENT_CLINIC_OR_DEPARTMENT_OTHER): Payer: Self-pay | Admitting: Emergency Medicine

## 2022-10-06 DIAGNOSIS — Z20822 Contact with and (suspected) exposure to covid-19: Secondary | ICD-10-CM | POA: Insufficient documentation

## 2022-10-06 DIAGNOSIS — J029 Acute pharyngitis, unspecified: Secondary | ICD-10-CM | POA: Diagnosis not present

## 2022-10-06 LAB — GROUP A STREP BY PCR: Group A Strep by PCR: NOT DETECTED

## 2022-10-06 LAB — RESP PANEL BY RT-PCR (RSV, FLU A&B, COVID)  RVPGX2
Influenza A by PCR: NEGATIVE
Influenza B by PCR: NEGATIVE
Resp Syncytial Virus by PCR: NEGATIVE
SARS Coronavirus 2 by RT PCR: NEGATIVE

## 2022-10-06 MED ORDER — LIDOCAINE VISCOUS HCL 2 % MT SOLN
15.0000 mL | Freq: Once | OROMUCOSAL | Status: AC
  Administered 2022-10-06: 15 mL via OROMUCOSAL
  Filled 2022-10-06: qty 15

## 2022-10-06 NOTE — ED Provider Notes (Signed)
[facility] EMERGENCY DEPARTMENT Provider Note   CSN: [id number] Arrival date & time: 10/06/22  2132     History  Chief Complaint  Patient presents with   Sore Throat    Dale Graham is a 36 y.o. male.  The history is provided by the patient.  Sore Throat This is a new problem. The current episode started more than 2 days ago. The problem occurs constantly. The problem has not changed since onset.Pertinent negatives include no abdominal pain, no headaches and no shortness of breath. Nothing aggravates the symptoms. Nothing relieves the symptoms. Treatments tried: tylenol. The treatment provided no relief.  Patient with a history of Hepatitis C who presents with sore throat of 5 days duration.  No fevers, no cough, no congestion.  No change in voice.       Home Medications Prior to Admission medications   Medication Sig Start Date End Date Taking? Authorizing Provider  citalopram (CELEXA) 40 MG tablet Take 1 tablet (40 mg total) by mouth daily. 06/22/21   Jerrol Banana, MD  Sofosbuvir-Velpatasvir (EPCLUSA) 400-100 MG TABS Take 1 tablet by mouth daily. 06/15/21   Lynn Ito, MD      Allergies    Patient has no known allergies.    Review of Systems   Review of Systems  Constitutional:  Negative for fever.  HENT:  Positive for sore throat. Negative for trouble swallowing and voice change.   Respiratory:  Negative for shortness of breath.   Gastrointestinal:  Negative for abdominal pain.  Neurological:  Negative for headaches.  All other systems reviewed and are negative.   Physical Exam Updated Vital Signs BP 121/85 (BP Location: Right Arm)   Pulse 87   Temp 98.1 F (36.7 C) (Oral)   Resp 18   Ht 5\' 11"  (1.803 m)   Wt 74.8 kg   SpO2 98%   BMI 23.01 kg/m  Physical Exam Vitals and nursing note reviewed.  Constitutional:      General: He is not in acute distress.    Appearance: He is well-developed. He is not diaphoretic.  HENT:      Head: Normocephalic and atraumatic.     Nose: Nose normal.     Mouth/Throat:     Mouth: Mucous membranes are moist.     Pharynx: Oropharynx is clear. No oropharyngeal exudate or posterior oropharyngeal erythema.  Eyes:     Conjunctiva/sclera: Conjunctivae normal.     Pupils: Pupils are equal, round, and reactive to light.  Neck:     Comments: Intact phonation no pain with displacement of the larynx  Cardiovascular:     Rate and Rhythm: Normal rate and regular rhythm.     Pulses: Normal pulses.     Heart sounds: Normal heart sounds.  Pulmonary:     Effort: Pulmonary effort is normal.     Breath sounds: Normal breath sounds. No wheezing or rales.  Abdominal:     General: Bowel sounds are normal.     Palpations: Abdomen is soft.     Tenderness: There is no abdominal tenderness. There is no guarding or rebound.  Musculoskeletal:        General: Normal range of motion.     Cervical back: Normal range of motion and neck supple.  Skin:    General: Skin is warm and dry.     Capillary Refill: Capillary refill takes less than 2 seconds.  Neurological:     General: No focal deficit present.  Mental Status: He is alert and oriented to person, place, and time.     Deep Tendon Reflexes: Reflexes normal.  Psychiatric:        Mood and Affect: Mood normal.        Behavior: Behavior normal.     ED Results / Procedures / Treatments   Labs (all labs ordered are listed, but only abnormal results are displayed) Labs Reviewed  RESP PANEL BY RT-PCR (RSV, FLU A&B, COVID)  [id number]  GROUP A STREP BY PCR    EKG None  Radiology No results found.  Procedures Procedures    Medications Ordered in ED Medications  lidocaine (XYLOCAINE) 2 % viscous mouth solution 15 mL (15 mLs Mouth/Throat Given 10/06/22 2354)    ED Course/ Medical Decision Making/ A&P                           Medical Decision Making Patient with 5 days of sore throat   Amount and/or Complexity of Data  Reviewed External Data Reviewed: notes.    Details: Previous notes reviewed  Labs: ordered.    Details: Negative covid and flu and strep  Risk Prescription drug management. Risk Details: Well appearing, sleeping in the room.  Phonation is intact.  Throat is normal without swelling.  Normal vital signs and exam.  No concern for deep infection in the neck at this time.  No LAN.  Stable for discharge.  Strict return.      Final Clinical Impression(s) / ED Diagnoses Final diagnoses:  None   Return for intractable cough, coughing up blood, fevers > 100.4 unrelieved by medication, shortness of breath, intractable vomiting, chest pain, shortness of breath, weakness, numbness, changes in speech, facial asymmetry, abdominal pain, passing out, Inability to tolerate liquids or food, cough, altered mental status or any concerns. No signs of systemic illness or infection. The patient is nontoxic-appearing on exam and vital signs are within normal limits.  I have reviewed the triage vital signs and the nursing notes. Pertinent labs & imaging results that were available during my care of the patient were reviewed by me and considered in my medical decision making (see chart for details). After history, exam, and medical workup I feel the patient has been appropriately medically screened and is safe for discharge home. Pertinent diagnoses were discussed with the patient. Patient was given return precautions.  Rx / DC Orders ED Discharge Orders     None         Harles Evetts, MD 10/07/22 0000

## 2022-10-06 NOTE — ED Triage Notes (Signed)
Pt reports sore throat x 5 days

## 2022-10-30 ENCOUNTER — Other Ambulatory Visit (HOSPITAL_COMMUNITY): Payer: Self-pay
